# Patient Record
Sex: Female | Born: 1980
Health system: Southern US, Community
[De-identification: ages and names within clinical notes are randomized; demographics above are authoritative.]

## PROBLEM LIST (undated history)

## (undated) ENCOUNTER — Inpatient Hospital Stay (HOSPITAL_COMMUNITY): Payer: Self-pay

## (undated) DIAGNOSIS — M1990 Unspecified osteoarthritis, unspecified site: Secondary | ICD-10-CM

## (undated) DIAGNOSIS — K8020 Calculus of gallbladder without cholecystitis without obstruction: Secondary | ICD-10-CM

## (undated) DIAGNOSIS — G43909 Migraine, unspecified, not intractable, without status migrainosus: Secondary | ICD-10-CM

## (undated) DIAGNOSIS — F329 Major depressive disorder, single episode, unspecified: Secondary | ICD-10-CM

## (undated) DIAGNOSIS — Z8742 Personal history of other diseases of the female genital tract: Secondary | ICD-10-CM

## (undated) DIAGNOSIS — F32A Depression, unspecified: Secondary | ICD-10-CM

## (undated) DIAGNOSIS — I8390 Asymptomatic varicose veins of unspecified lower extremity: Secondary | ICD-10-CM

## (undated) DIAGNOSIS — R87619 Unspecified abnormal cytological findings in specimens from cervix uteri: Secondary | ICD-10-CM

## (undated) DIAGNOSIS — E669 Obesity, unspecified: Secondary | ICD-10-CM

## (undated) DIAGNOSIS — IMO0002 Reserved for concepts with insufficient information to code with codable children: Secondary | ICD-10-CM

## (undated) HISTORY — DX: Asymptomatic varicose veins of unspecified lower extremity: I83.90

## (undated) HISTORY — DX: Reserved for concepts with insufficient information to code with codable children: IMO0002

## (undated) HISTORY — DX: Personal history of other diseases of the female genital tract: Z87.42

## (undated) HISTORY — DX: Obesity, unspecified: E66.9

## (undated) HISTORY — DX: Unspecified abnormal cytological findings in specimens from cervix uteri: R87.619

## (undated) HISTORY — PX: GALLBLADDER SURGERY: SHX652

## (undated) HISTORY — DX: Unspecified osteoarthritis, unspecified site: M19.90

## (undated) HISTORY — DX: Depression, unspecified: F32.A

## (undated) HISTORY — DX: Calculus of gallbladder without cholecystitis without obstruction: K80.20

## (undated) HISTORY — DX: Major depressive disorder, single episode, unspecified: F32.9

## (undated) HISTORY — PX: WISDOM TOOTH EXTRACTION: SHX21

---

## 2003-01-26 ENCOUNTER — Other Ambulatory Visit: Admission: RE | Admit: 2003-01-26 | Discharge: 2003-01-26 | Payer: Self-pay | Admitting: *Deleted

## 2005-05-02 ENCOUNTER — Other Ambulatory Visit: Admission: RE | Admit: 2005-05-02 | Discharge: 2005-05-02 | Payer: Self-pay | Admitting: *Deleted

## 2006-04-16 DIAGNOSIS — R87619 Unspecified abnormal cytological findings in specimens from cervix uteri: Secondary | ICD-10-CM

## 2006-04-16 HISTORY — DX: Unspecified abnormal cytological findings in specimens from cervix uteri: R87.619

## 2006-04-16 HISTORY — PX: LEEP: SHX91

## 2006-08-28 ENCOUNTER — Other Ambulatory Visit: Admission: RE | Admit: 2006-08-28 | Discharge: 2006-08-28 | Payer: Self-pay | Admitting: *Deleted

## 2006-11-05 ENCOUNTER — Other Ambulatory Visit: Admission: RE | Admit: 2006-11-05 | Discharge: 2006-11-05 | Payer: Self-pay | Admitting: *Deleted

## 2006-12-30 ENCOUNTER — Ambulatory Visit (HOSPITAL_COMMUNITY): Admission: RE | Admit: 2006-12-30 | Discharge: 2006-12-30 | Payer: Self-pay | Admitting: *Deleted

## 2006-12-30 ENCOUNTER — Encounter (INDEPENDENT_AMBULATORY_CARE_PROVIDER_SITE_OTHER): Payer: Self-pay | Admitting: *Deleted

## 2007-07-14 ENCOUNTER — Other Ambulatory Visit: Admission: RE | Admit: 2007-07-14 | Discharge: 2007-07-14 | Payer: Self-pay | Admitting: *Deleted

## 2007-09-16 ENCOUNTER — Encounter: Admission: RE | Admit: 2007-09-16 | Discharge: 2007-09-16 | Payer: Self-pay | Admitting: Gynecology

## 2008-08-23 ENCOUNTER — Encounter: Admission: RE | Admit: 2008-08-23 | Discharge: 2008-08-23 | Payer: Self-pay | Admitting: Family Medicine

## 2010-01-30 ENCOUNTER — Ambulatory Visit: Payer: Self-pay | Admitting: Obstetrics & Gynecology

## 2010-01-30 LAB — CONVERTED CEMR LAB
HCT: 39.6 % (ref 36.0–46.0)
Hemoglobin: 13.2 g/dL (ref 12.0–15.0)
MCHC: 33.3 g/dL (ref 30.0–36.0)
MCV: 89 fL (ref 78.0–100.0)
Platelets: 369 10*3/uL (ref 150–400)
RBC: 4.45 M/uL (ref 3.87–5.11)
RDW: 13 % (ref 11.5–15.5)
TSH: 0.747 microintl units/mL (ref 0.350–4.500)
WBC: 9.4 10*3/uL (ref 4.0–10.5)

## 2010-02-09 ENCOUNTER — Ambulatory Visit (HOSPITAL_COMMUNITY): Admission: RE | Admit: 2010-02-09 | Discharge: 2010-02-09 | Payer: Self-pay | Admitting: Obstetrics & Gynecology

## 2010-02-23 ENCOUNTER — Ambulatory Visit: Payer: Self-pay | Admitting: Obstetrics & Gynecology

## 2010-02-23 LAB — CONVERTED CEMR LAB
Chlamydia, Swab/Urine, PCR: NEGATIVE
GC Probe Amp, Urine: NEGATIVE

## 2010-05-29 ENCOUNTER — Ambulatory Visit: Payer: 59 | Admitting: Obstetrics & Gynecology

## 2010-05-29 DIAGNOSIS — Z01419 Encounter for gynecological examination (general) (routine) without abnormal findings: Secondary | ICD-10-CM

## 2010-05-29 DIAGNOSIS — Z1272 Encounter for screening for malignant neoplasm of vagina: Secondary | ICD-10-CM

## 2010-07-07 NOTE — Assessment & Plan Note (Signed)
NAME:  Kristin, [name] NO.:  [id number]  MEDICAL RECORD NO.:  [id number]           PATIENT TYPE:  LOCATION:  [facility] at [facility]           FACILITY:  PHYSICIAN:  Allie Bossier, MD        DATE OF BIRTH:  April 15, 1981  DATE OF SERVICE:  05/29/2010                                 CLINIC NOTE  HISTORY OF PRESENT ILLNESS:  Kristin [name] is a 30 year old single white gravida 0 who was previously seen by me for severe dysmenorrhea.  She has been placed on Loestrin.  Her periods have dwindled to 1-2 days with some brown spotting and her pain is virtually nonexistent.  She has no GYN complaints.  She would like a prescription for generic birth control pills, as she cannot afford the 40 dollars but nevertheless does require them on a monthly basis.  PAST MEDICAL HISTORY:  She is a smoker, history of varicose vein status post vein injections, history of dysmenorrhea, history of the HPV Pap. She is overweight and she is "emotional" with her periods.  REVIEW OF SYSTEMS:  She is status post a Gardasil series.  Her last Pap smear was normal.  She denies dyspareunia.  MEDICATIONS:  Loestrin daily.  FAMILY HISTORY:  Her mom has breast cancer and that her maternal grandmother.  There is no GYN or colon malignancies.  PAST SURGICAL HISTORY:  She had a LEEP in 2008 and wisdom tooth extraction.  ALLERGIES:  No latex allergies.  No known drug allergies.  SOCIAL HISTORY:  She smokes half-a-pack a day for the last 10 years and drinks alcohol on a social basis.  PHYSICAL EXAMINATION:  GENERAL:  Well-nourished, well-hydrated, pleasant white female, height 5 feet 71/2 inches.  Weight 172, blood pressure 126/75, and pulse 81. HEENT:  Normal. HEART:  Regular rate and rhythm. LUNGS:  Clear to auscultation bilaterally. BREASTS:  Normal.  On her right breast, she has a 1-cm marble-like lesion just to the right of her nipple at the 9 o'clock position. ABDOMEN:  Benign. EXTERNAL GENITALIA:  No  lesions (please note the patient has been concerned about area on her right labia majora, but what I am seeing is normal).  Cervix nulliparous.  Uterus normal size and shape retroverted. Adnexa nontender.  No masses.  ASSESSMENT AND PLAN: 1. Annual exam.  I have checked Pap smear.  Recommend self-breast and     self-vulvar exams.  Please note, her cervical cultures were     recently negative.  So, I did not repeat them today. 2. Dysmenorrhea.  We will continue with oral contraceptive pills.  I     have written her a generic prescription. 3. With regard to this her area that she was concerned about on her     right labia majora, I have suggested that she keep on it on a     monthly basis and if it changes over the next several months bring     it again. 4. Breast mass.  Diera states that this has been evaluated with     ultrasound as well as a visit to the breast clinic.  She says it     has not changed in years, and  I will obtain these records before     proceeding.  She is aware that she has strong family history for     breast cancer, and she has been made aware of the BRCA1 and 2     testing.     Allie Bossier, MD    MCD/MEDQ  D:  05/29/2010  T:  05/30/2010  Job:  [id number]

## 2010-08-29 NOTE — Assessment & Plan Note (Signed)
NAME:  Kristin [name], Kristin [name] NO.:  [id number]   MEDICAL RECORD NO.:  [id number]          PATIENT TYPE:  POB   LOCATION:  [facility] at [facility]         FACILITY:  [facility]   PHYSICIAN:  Allie Bossier, MD        DATE OF BIRTH:  05/31/1980   DATE OF SERVICE:                                  CLINIC NOTE   Kristin [name] is a 30 year old single white gravida 0 who was seen here for a  new patient last month.  She was coming in because of irregular periods  as well as severe dysmenorrhea.  She was given a prescription for  Ponstel to be used on a p.r.n. basis and I ordered a TSH, CBC and GYN  ultrasound.  I also ordered cervical cultures, but they were not done  before she left.  She had her ultrasound, it was entirely normal.  Her  labs were also normal including a normal TSH and normal white count and  hemoglobin.  Today, she tells me that she has not had a period since she  saw me last and says she has not taken the Ponstel.  I had given her  information about Mirena at her last visit and she is uncertain whether  she wants Mirena.  After further discussion today about combination OCPs  as well as Mirena, she has opted to have combination OCPs.  She was  previously not placed on them because of varicose veins, however, I  have told her that varicose veins are not a contraindication to using  combination OCPs.  I have given her samples of 3 months for Loestrin as  well as a prescription.  She will get a blood pressure checked in 2  weeks (she will have this done in [place] and call the results here).  She will start her pills today.  She gives me a history that with  progestin-only pills, she gets occasional darkening of her facial skin.  I have explained that this can occur with combination OCPs and that if  she does get that she should stop them immediately.  She will consider a  Mirena in the in the future.      Allie Bossier, MD     MCD/MEDQ  D:  02/23/2010  T:  02/24/2010   Job:  [id number]

## 2010-08-29 NOTE — Op Note (Signed)
NAMEINANNA, TELFORD NO.:  [id number]   MEDICAL RECORD NO.:  [id number]          PATIENT TYPE:  AMB   LOCATION:  DAY                          FACILITY:  [facility]   PHYSICIAN:  Almedia Balls. Fore, M.D.   DATE OF BIRTH:  1980-06-08   DATE OF PROCEDURE:  12/30/2006  DATE OF DISCHARGE:                               OPERATIVE REPORT   PREOPERATIVE DIAGNOSIS:  High-grade squamous intraepithelial lesion.   POSTOPERATIVE DIAGNOSIS:  High-grade squamous intraepithelial lesion  pending pathology.   OPERATION:  LEEP of cervix.   ANESTHESIA:  MAC with 10 mL 1% lidocaine paracervical block.   INDICATIONS FOR SURGERY:  The patient is a 30 year old with the above-  noted problems for a LEEP procedure as noted above.  She was fully  counseled as to the nature of the procedure, and the risks involved  including risks of anesthesia, injury to the cervix, uterus, tubes,  ovaries, bowel, bladder blood vessels, ureters, postoperative  hemorrhage, infection, and recuperation.  She fully understands all  these considerations and has signed informed consent to proceed on  December 30, 2006.   OPERATIVE FINDINGS:  On staining the cervix with dilute acetic acid  solution, there were noted to acetyl white areas on the anterior and  some on the posterior lips of the cervix.   DESCRIPTION OF PROCEDURE:  With the patient under sedation, prepared and  draped in the usual sterile fashion in the dorsal lithotomy position, a  speculum was placed in the vagina.  The acetic acid solution was placed  on the cervix with the above-noted findings.  A solution of 1% lidocaine  with 1:100,000 epinephrine was injected circumferentially on the cervix  for a total of 10 mL for hemostasis and anesthesia.   The 10 x 10 loop electrode with the guard taking it down to  approximately a 5 mm depth was then utilized to remove the anterior lip  of the cervix, posterior lip of the cervix, and the endocervical  area.  The area was then cauterized using the large ball electrode at 50 watts  of power.  A solution of Monsel's suspension was then utilized for  further hemostasis.   The area was observed for several minutes, following the procedure, and  after noting that hemostasis was maintained, and that sponge and  instrument counts were correct, the procedure was terminated.  Estimated  blood loss less than 10 mL.  The patient was taken to the recovery room  in good condition.   FOLLOWUP CARE:  She will return the office in 2 weeks for followup and  was instructed to call if heavy bleeding, pain, or unexplained fever  should ensue.  The specimen went to pathology.          ______________________________  Almedia Balls Randell Patient, M.D.    SRF/MEDQ  D:  12/30/2006  T:  12/30/2006  Job:  [id number]

## 2010-08-29 NOTE — Assessment & Plan Note (Signed)
NAME:  Kristin [name], Kristin [name] NO.:  [id number]   MEDICAL RECORD NO.:  [id number]          PATIENT TYPE:  POB   LOCATION:  [facility] at [facility]         FACILITY:  [facility]   PHYSICIAN:  Allie Bossier, MD        DATE OF BIRTH:  February 09, 1981   DATE OF SERVICE:  01/30/2010                                  CLINIC NOTE   Kristin [name] is a 30 year old single white gravida 0 who comes in at the  referral from mother Kristin [name] to evaluate her severely painful  periods and her lengthening cycles as well as 2 periods a month.  She  was placed on progestin only birth control pills about a year ago by her  previous gynecologist who used progestin only pills because she has  severe varicose veins and she has complained of irregular bleeding since  that time.  Prior to that, she started her period at age 64 and prior to  that, the birth control pills was having monthly periods, but they were  extremely painful, almost disabling.  She occasionally misses work.  She  previously would miss school and over-the-counter pain medicines has not  been effective in relieving her pain.  She says the cervical cultures in  the past are negative.   PAST MEDICAL HISTORY:  Varicose vein.   She is a smoker, dysmenorrhea, history of HPV Pap smears, and she is  overweight.   PAST SURGICAL HISTORY:  She had a LEEP in 2008 and wisdom teeth  extraction.   FAMILY HISTORY:  Positive for breast cancer in her mother (negative BRCA  1 and 2 in the mother) and maternal grandmother.  No family history of  GYN or colon malignancies.   No latex allergies.  No known drug allergies.   SOCIAL HISTORY:  She smokes half-a-pack a day for last 10 years and she  reports social alcohol use.   REVIEW OF SYSTEMS:  She has had a Gardasil series.  Her last Pap smear  was normal that was done in January of this year and she denies  dyspareunia.   PHYSICAL EXAMINATION:  GENERAL:  Well-nourished, well-hydrated pleasant  white  female.  VITAL SIGNS:  Height 5 feet 7.5 inches, weight 167, blood pressure  129/83, pulse 98.  EXTERNAL GENITALIA:  Shaved, no lesions.  Cervix small amount of  bleeding (she is finishing her period).  Uterus is normal size and  shape, retroverted, tender.  Bimanual exam, her adnexa are nontender  with no masses.   ASSESSMENT AND PLAN:  1. Dysfunctional uterine bleeding.  This may be related to her      progestin only pill.  However, I have done cervical cultures, a      TSH, and CBC.  I am ordering a GYN ultrasound.  I have given her      literature on a Mirena IUD.  I have also briefly told her that      varicose veins are not a contraindication to estrogen containing      oral contraceptive pills.  2. Severe dysmenorrhea.  I have given her prescription and samples for      Ponstel (250 mg p.o.  q.6 h. p.r.n. pain and I am very suspicious      that she has endometriosis).  Again, combination of OCPs or Mirena      IUD would be      one effective form of birth control that would hopefully relieve      her dysmenorrhea as well as her irregular periods.  I will see her      back when the ultrasound results are available.      Allie Bossier, MD     MCD/MEDQ  D:  01/30/2010  T:  01/31/2010  Job:  [id number]

## 2011-01-25 LAB — PREGNANCY, URINE: Preg Test, Ur: NEGATIVE

## 2011-06-07 ENCOUNTER — Encounter: Payer: Self-pay | Admitting: Obstetrics & Gynecology

## 2011-06-07 ENCOUNTER — Ambulatory Visit (INDEPENDENT_AMBULATORY_CARE_PROVIDER_SITE_OTHER): Payer: 59 | Admitting: Obstetrics & Gynecology

## 2011-06-07 DIAGNOSIS — Z1272 Encounter for screening for malignant neoplasm of vagina: Secondary | ICD-10-CM

## 2011-06-07 DIAGNOSIS — Z113 Encounter for screening for infections with a predominantly sexual mode of transmission: Secondary | ICD-10-CM

## 2011-06-07 DIAGNOSIS — Z0000 Encounter for general adult medical examination without abnormal findings: Secondary | ICD-10-CM

## 2011-06-07 DIAGNOSIS — N63 Unspecified lump in unspecified breast: Secondary | ICD-10-CM

## 2011-06-07 DIAGNOSIS — Z01419 Encounter for gynecological examination (general) (routine) without abnormal findings: Secondary | ICD-10-CM

## 2011-06-07 MED ORDER — NORETHIN ACE-ETH ESTRAD-FE 1-20 MG-MCG PO TABS: 1.0000 | ORAL_TABLET | Freq: Every day | ORAL | Status: DC

## 2011-06-07 NOTE — Progress Notes (Signed)
Subjective:    Kristin [name] is a 31 y.o. female who presents for an annual exam. The patient has no complaints today. She needs a refill of her generic Lo loestrin. The patient is sexually active. GYN screening history: last pap: was normal. The patient wears seatbelts: yes. The patient participates in regular exercise: no. Has the patient ever been transfused or tattooed?: yes. The patient reports that there is not domestic violence in her life.   Menstrual History: OB History    Grav Para Term Preterm Abortions TAB SAB Ect Mult Living   0 0 0 0 0 0 0 0 0 0       Menarche age: 47 Patient's last menstrual period was 05/11/2011.    The following portions of the patient's history were reviewed and updated as appropriate: allergies, current medications, past family history, past medical history, past social history, past surgical history and problem list.  Review of Systems A comprehensive review of systems was negative. She is still acting, has a new partner for the last 5 months, denies dysparunia, says her primary care MD checked her TSH recently and it was normal. She had a breast u/s in about 2009 after she found a right breast mass but did not follow up.   Objective:    BP 129/86  Pulse 82  Ht 5\' 7"  (1.702 m)  Wt 186 lb (84.369 kg)  BMI 29.13 kg/m2  LMP 05/11/2011  General Appearance:    Alert, cooperative, no distress, appears stated age  Head:    Normocephalic, without obvious abnormality, atraumatic  Eyes:    PERRL, conjunctiva/corneas clear, EOM's intact, fundi    benign, both eyes  Ears:    Normal TM's and external ear canals, both ears  Nose:   Nares normal, septum midline, mucosa normal, no drainage    or sinus tenderness  Throat:   Lips, mucosa, and tongue normal; teeth and gums normal  Neck:   Supple, symmetrical, trachea midline, no adenopathy;    thyroid:  no enlargement/tenderness/nodules; no carotid   bruit or JVD  Back:     Symmetric, no curvature, ROM normal,  no CVA tenderness  Lungs:     Clear to auscultation bilaterally, respirations unlabored  Chest Wall:    No tenderness or deformity   Heart:    Regular rate and rhythm, S1 and S2 normal, no murmur, rub   or gallop  Breast Exam:    A 1 cm firm mobile mass at the 9 o'clock position just beside the right nipple. She says it hasn't changed in 3 years.  Abdomen:     Soft, non-tender, bowel sounds active all four quadrants,    no masses, no organomegaly  Genitalia:    Normal female without lesion, discharge or tenderness, NSSA, NT, no adnexal masses     Extremities:   Extremities normal, atraumatic, no cyanosis or edema  Pulses:   2+ and symmetric all extremities  Skin:   Skin color, texture, turgor normal, no rashes or lesions  Lymph nodes:   Cervical, supraclavicular, and axillary nodes normal  Neurologic:   CNII-XII intact, normal strength, sensation and reflexes    throughout  .    Assessment:    Healthy female exam.  Right breast mass   Plan:     Mammogram. Pap smear.  Referral to general surgeon for second opinion

## 2011-06-11 ENCOUNTER — Other Ambulatory Visit: Payer: Self-pay | Admitting: Obstetrics & Gynecology

## 2011-06-13 ENCOUNTER — Ambulatory Visit
Admission: RE | Admit: 2011-06-13 | Discharge: 2011-06-13 | Disposition: A | Discharge status: Home / Self Care | Payer: 59 | Source: Ambulatory Visit | Attending: Obstetrics & Gynecology | Admitting: Obstetrics & Gynecology

## 2011-06-13 ENCOUNTER — Other Ambulatory Visit: Payer: Self-pay | Admitting: Obstetrics & Gynecology

## 2011-06-13 DIAGNOSIS — N63 Unspecified lump in unspecified breast: Secondary | ICD-10-CM

## 2011-06-25 ENCOUNTER — Encounter: Payer: 59 | Admitting: Obstetrics & Gynecology

## 2011-07-10 ENCOUNTER — Ambulatory Visit
Admission: RE | Admit: 2011-07-10 | Discharge: 2011-07-10 | Disposition: A | Discharge status: Home / Self Care | Payer: 59 | Source: Ambulatory Visit | Attending: Obstetrics & Gynecology | Admitting: Obstetrics & Gynecology

## 2011-07-10 DIAGNOSIS — N63 Unspecified lump in unspecified breast: Secondary | ICD-10-CM

## 2011-07-12 ENCOUNTER — Other Ambulatory Visit: Payer: Self-pay | Admitting: Obstetrics & Gynecology

## 2011-08-02 ENCOUNTER — Encounter: Payer: Self-pay | Admitting: Obstetrics & Gynecology

## 2011-08-02 ENCOUNTER — Ambulatory Visit (INDEPENDENT_AMBULATORY_CARE_PROVIDER_SITE_OTHER): Payer: 59 | Admitting: Obstetrics & Gynecology

## 2011-08-02 VITALS — BP 124/82 | HR 82 | Ht 67.0 in | Wt 193.0 lb

## 2011-08-02 DIAGNOSIS — Z01812 Encounter for preprocedural laboratory examination: Secondary | ICD-10-CM

## 2011-08-02 DIAGNOSIS — R87612 Low grade squamous intraepithelial lesion on cytologic smear of cervix (LGSIL): Secondary | ICD-10-CM

## 2011-08-02 NOTE — Progress Notes (Signed)
Patient is here for colposcopy today.

## 2011-08-02 NOTE — Progress Notes (Signed)
Patient ID: Kristin [name], female   DOB: April 10, 1981, 31 y.o.   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 She has had a LEEP in the past with Dr. Randell Patient.  O. UPT negative, consent signed, time out done      Cervix treated with acetic acid.       Colposcopy adequate, changes c/w LEEP noted. Only mild dysplasia noted.      ECC done  A/P. LGSIL on pap, c/w colpo findings. If ECC negative, pap in 6 months.

## 2012-02-26 ENCOUNTER — Ambulatory Visit (INDEPENDENT_AMBULATORY_CARE_PROVIDER_SITE_OTHER): Payer: 59 | Admitting: Obstetrics & Gynecology

## 2012-02-26 ENCOUNTER — Encounter: Payer: Self-pay | Admitting: Obstetrics & Gynecology

## 2012-02-26 VITALS — BP 141/95 | HR 95 | Ht 67.0 in | Wt 197.0 lb

## 2012-02-26 DIAGNOSIS — Z202 Contact with and (suspected) exposure to infections with a predominantly sexual mode of transmission: Secondary | ICD-10-CM

## 2012-02-26 DIAGNOSIS — Z9189 Other specified personal risk factors, not elsewhere classified: Secondary | ICD-10-CM

## 2012-02-26 NOTE — Progress Notes (Signed)
  Subjective:    Patient ID: Kristin [name], female    DOB: 1980-05-27, 31 y.o.   MRN: [id number]  HPI  [name] is a SW G0 whose boyfriend noted white lesions on his penis that sound consistent with molloscum. He will seeing his doctor today. She wants to have herself checked.  Review of Systems Her annual and pap smear are due 1/14.    Objective:   Physical Exam Clean shaven vulva Normal vagina and discharge Ectropion noted on cervix       Assessment & Plan:   Reassurance given I will check for GC/CT today RTC 2 3 months/prn sooner

## 2012-05-09 ENCOUNTER — Other Ambulatory Visit: Payer: Self-pay | Admitting: Family Medicine

## 2012-08-04 ENCOUNTER — Other Ambulatory Visit: Payer: Self-pay | Admitting: Obstetrics & Gynecology

## 2012-08-07 ENCOUNTER — Ambulatory Visit: Payer: 59 | Admitting: Obstetrics & Gynecology

## 2012-09-30 ENCOUNTER — Ambulatory Visit (INDEPENDENT_AMBULATORY_CARE_PROVIDER_SITE_OTHER): Payer: 59 | Admitting: Obstetrics & Gynecology

## 2012-09-30 ENCOUNTER — Encounter: Payer: Self-pay | Admitting: Obstetrics & Gynecology

## 2012-09-30 VITALS — BP 118/77 | HR 86 | Ht 67.5 in | Wt 194.0 lb

## 2012-09-30 DIAGNOSIS — Z113 Encounter for screening for infections with a predominantly sexual mode of transmission: Secondary | ICD-10-CM

## 2012-09-30 DIAGNOSIS — Z0000 Encounter for general adult medical examination without abnormal findings: Secondary | ICD-10-CM

## 2012-09-30 DIAGNOSIS — Z124 Encounter for screening for malignant neoplasm of cervix: Secondary | ICD-10-CM

## 2012-09-30 DIAGNOSIS — R6882 Decreased libido: Secondary | ICD-10-CM

## 2012-09-30 DIAGNOSIS — Z01419 Encounter for gynecological examination (general) (routine) without abnormal findings: Secondary | ICD-10-CM

## 2012-09-30 DIAGNOSIS — Z1151 Encounter for screening for human papillomavirus (HPV): Secondary | ICD-10-CM

## 2012-09-30 NOTE — Progress Notes (Signed)
Here today for gyn physical.  Has noticed decreased libido.

## 2012-09-30 NOTE — Progress Notes (Addendum)
Subjective:    Kristin [name] is a 32 y.o. female who presents for an annual exam. The patient has no complaints today except "I have no sex drive and I feel tired all the time". She had her TSH checked by Dr. Laurine Blazer (FP). She also had a sleep study that was reportedly normal. The patient is sexually active (once or twice per week) She has some stress, works at the [employer] and then comes home to 3 kids (13,8, and 5 yo). GYN screening history: last pap: was normal. The patient wears seatbelts: yes. The patient participates in regular exercise: yes. Has the patient ever been transfused or tattooed?: yes. The patient reports that there is not domestic violence in her life.   Menstrual History: OB History   Grav Para Term Preterm Abortions TAB SAB Ect Mult Living   0 0 0 0 0 0 0 0 0 0       Menarche age: 8  Patient's last menstrual period was 09/28/2012.    The following portions of the patient's history were reviewed and updated as appropriate: allergies, current medications, past family history, past medical history, past social history, past surgical history and problem list.  Review of Systems A comprehensive review of systems was negative.  She moved in with her fiance about 1 year ago. Her libido has been decreasing for the last 9 months.   Objective:    BP 118/77  Pulse 86  Ht 5' 7.5" (1.715 m)  Wt 194 lb (87.998 kg)  BMI 29.92 kg/m2  LMP 09/28/2012  General Appearance:    Alert, cooperative, no distress, appears stated age, tearful (same at each visit)  Head:    Normocephalic, without obvious abnormality, atraumatic  Eyes:    PERRL, conjunctiva/corneas clear, EOM's intact, fundi    benign, both eyes  Ears:    Normal TM's and external ear canals, both ears  Nose:   Nares normal, septum midline, mucosa normal, no drainage    or sinus tenderness  Throat:   Lips, mucosa, and tongue normal; teeth and gums normal  Neck:   Supple, symmetrical, trachea midline, no adenopathy;   thyroid:  no enlargement/tenderness/nodules; no carotid   bruit or JVD  Back:     Symmetric, no curvature, ROM normal, no CVA tenderness  Lungs:     Clear to auscultation bilaterally, respirations unlabored  Chest Wall:    No tenderness or deformity   Heart:    Regular rate and rhythm, S1 and S2 normal, no murmur, rub   or gallop  Breast Exam:    No tenderness, masses, or nipple abnormality of left. Right breast with 1.5 cm mobile lump, same as last year (biopsy negative). She does her SBEs and says that it is the same  Abdomen:     Soft, non-tender, bowel sounds active all four quadrants,    no masses, no organomegaly  Genitalia:    Normal female without lesion, discharge or tenderness, NSSA, NT, normal adnexal exam      Extremities:   Extremities normal, atraumatic, no cyanosis or edema  Pulses:   2+ and symmetric all extremities  Skin:   Skin color, texture, turgor normal, no rashes or lesions  Lymph nodes:   Cervical, supraclavicular, and axillary nodes normal  Neurologic:   CNII-XII intact, normal strength, sensation and reflexes    throughout  .    Assessment:    Healthy female exam.  Decreased libido   Plan:     Thin prep Pap  smear.  with HPV cotesting Check free testosterone

## 2012-10-01 LAB — TESTOSTERONE, FREE, TOTAL, SHBG
Sex Hormone Binding: 67 nmol/L (ref 18–114)
Testosterone, Free: 5.7 pg/mL (ref 0.6–6.8)
Testosterone: 51 ng/dL (ref 10–70)

## 2013-04-30 ENCOUNTER — Ambulatory Visit (INDEPENDENT_AMBULATORY_CARE_PROVIDER_SITE_OTHER): Payer: 59 | Admitting: Obstetrics & Gynecology

## 2013-04-30 ENCOUNTER — Encounter: Payer: Self-pay | Admitting: Obstetrics & Gynecology

## 2013-04-30 VITALS — BP 130/94 | HR 78 | Ht 67.0 in | Wt 199.0 lb

## 2013-04-30 DIAGNOSIS — N926 Irregular menstruation, unspecified: Secondary | ICD-10-CM

## 2013-04-30 DIAGNOSIS — N925 Other specified irregular menstruation: Secondary | ICD-10-CM

## 2013-04-30 DIAGNOSIS — N949 Unspecified condition associated with female genital organs and menstrual cycle: Secondary | ICD-10-CM

## 2013-04-30 MED ORDER — NORETHIN ACE-ETH ESTRAD-FE 1-20 MG-MCG PO TABS: 1.0000 | ORAL_TABLET | Freq: Every day | ORAL | Status: DC

## 2013-04-30 NOTE — Progress Notes (Signed)
   Subjective:    Patient ID: Kristin [name], female    DOB: 1980-08-19, 33 y.o.   MRN: [id number]  HPI 33 yo engaged W P0 on Lo lo estrin here today because she is 3 weeks late for her period. She had a negative blood and urine pregnancy on 04-23-13 [facility]. Her wedding is in 4 months. She is under a fair amount of stress.   Review of Systems Her flu vaccine and pap are UTD.    Objective:   Physical Exam        Assessment & Plan:  3 weeks late for period- most likely due to her OCPs and thin uterine lining but I will check a TSH.

## 2013-05-01 LAB — TSH: TSH: 1.31 u[IU]/mL (ref 0.350–4.500)

## 2013-09-18 ENCOUNTER — Other Ambulatory Visit: Payer: Self-pay | Admitting: Oral Surgery

## 2013-12-12 ENCOUNTER — Emergency Department (HOSPITAL_BASED_OUTPATIENT_CLINIC_OR_DEPARTMENT_OTHER)
Admission: EM | Admit: 2013-12-12 | Discharge: 2013-12-12 | Disposition: A | Discharge status: Home / Self Care | Payer: 59 | Attending: Emergency Medicine | Admitting: Emergency Medicine

## 2013-12-12 ENCOUNTER — Encounter (HOSPITAL_BASED_OUTPATIENT_CLINIC_OR_DEPARTMENT_OTHER): Payer: Self-pay | Admitting: Emergency Medicine

## 2013-12-12 DIAGNOSIS — R112 Nausea with vomiting, unspecified: Secondary | ICD-10-CM | POA: Insufficient documentation

## 2013-12-12 DIAGNOSIS — F17200 Nicotine dependence, unspecified, uncomplicated: Secondary | ICD-10-CM | POA: Diagnosis not present

## 2013-12-12 DIAGNOSIS — G43909 Migraine, unspecified, not intractable, without status migrainosus: Secondary | ICD-10-CM | POA: Diagnosis not present

## 2013-12-12 DIAGNOSIS — Z8679 Personal history of other diseases of the circulatory system: Secondary | ICD-10-CM | POA: Insufficient documentation

## 2013-12-12 DIAGNOSIS — R197 Diarrhea, unspecified: Secondary | ICD-10-CM | POA: Insufficient documentation

## 2013-12-12 DIAGNOSIS — F329 Major depressive disorder, single episode, unspecified: Secondary | ICD-10-CM | POA: Insufficient documentation

## 2013-12-12 DIAGNOSIS — R1084 Generalized abdominal pain: Secondary | ICD-10-CM | POA: Insufficient documentation

## 2013-12-12 DIAGNOSIS — Z8742 Personal history of other diseases of the female genital tract: Secondary | ICD-10-CM | POA: Diagnosis not present

## 2013-12-12 DIAGNOSIS — Z79899 Other long term (current) drug therapy: Secondary | ICD-10-CM | POA: Insufficient documentation

## 2013-12-12 DIAGNOSIS — F3289 Other specified depressive episodes: Secondary | ICD-10-CM | POA: Diagnosis not present

## 2013-12-12 DIAGNOSIS — Z8719 Personal history of other diseases of the digestive system: Secondary | ICD-10-CM | POA: Diagnosis not present

## 2013-12-12 DIAGNOSIS — R000 Tachycardia, unspecified: Secondary | ICD-10-CM | POA: Insufficient documentation

## 2013-12-12 DIAGNOSIS — Z8739 Personal history of other diseases of the musculoskeletal system and connective tissue: Secondary | ICD-10-CM | POA: Diagnosis not present

## 2013-12-12 HISTORY — DX: Migraine, unspecified, not intractable, without status migrainosus: G43.909

## 2013-12-12 LAB — URINALYSIS, ROUTINE W REFLEX MICROSCOPIC
Bilirubin Urine: NEGATIVE
Glucose, UA: NEGATIVE mg/dL
Ketones, ur: NEGATIVE mg/dL
Leukocytes, UA: NEGATIVE
Nitrite: NEGATIVE
Protein, ur: NEGATIVE mg/dL
SPECIFIC GRAVITY, URINE: 1.009 (ref 1.005–1.030)
Urobilinogen, UA: 1 mg/dL (ref 0.0–1.0)
pH: 7 (ref 5.0–8.0)

## 2013-12-12 LAB — CBC WITH DIFFERENTIAL/PLATELET
BASOS PCT: 0 % (ref 0–1)
Band Neutrophils: 2 % (ref 0–10)
Basophils Absolute: 0 10*3/uL (ref 0.0–0.1)
Eosinophils Absolute: 0.1 10*3/uL (ref 0.0–0.7)
Eosinophils Relative: 2 % (ref 0–5)
HCT: 38.6 % (ref 36.0–46.0)
HEMOGLOBIN: 13 g/dL (ref 12.0–15.0)
LYMPHS PCT: 39 % (ref 12–46)
Lymphs Abs: 2.7 10*3/uL (ref 0.7–4.0)
MCH: 30.1 pg (ref 26.0–34.0)
MCHC: 33.7 g/dL (ref 30.0–36.0)
MCV: 89.4 fL (ref 78.0–100.0)
Monocytes Absolute: 0.8 10*3/uL (ref 0.1–1.0)
Monocytes Relative: 11 % (ref 3–12)
Neutro Abs: 3.3 10*3/uL (ref 1.7–7.7)
Neutrophils Relative %: 46 % (ref 43–77)
Platelets: 296 10*3/uL (ref 150–400)
RBC: 4.32 MIL/uL (ref 3.87–5.11)
RDW: 12.1 % (ref 11.5–15.5)
WBC: 6.9 10*3/uL (ref 4.0–10.5)

## 2013-12-12 LAB — COMPREHENSIVE METABOLIC PANEL
ALBUMIN: 3.3 g/dL — AB (ref 3.5–5.2)
ALK PHOS: 58 U/L (ref 39–117)
ALT: 14 U/L (ref 0–35)
ANION GAP: 13 (ref 5–15)
AST: 20 U/L (ref 0–37)
BUN: 5 mg/dL — AB (ref 6–23)
CO2: 25 mEq/L (ref 19–32)
Calcium: 9.2 mg/dL (ref 8.4–10.5)
Chloride: 105 mEq/L (ref 96–112)
Creatinine, Ser: 0.7 mg/dL (ref 0.50–1.10)
GFR calc Af Amer: >90 mL/min (ref >90)
GFR calc non Af Amer: >90 mL/min (ref >90)
Glucose, Bld: 91 mg/dL (ref 70–99)
POTASSIUM: 3.2 meq/L — AB (ref 3.7–5.3)
Sodium: 143 mEq/L (ref 137–147)
TOTAL PROTEIN: 7.2 g/dL (ref 6.0–8.3)
Total Bilirubin: 0.2 mg/dL — ABNORMAL LOW (ref 0.3–1.2)

## 2013-12-12 LAB — URINE MICROSCOPIC-ADD ON

## 2013-12-12 LAB — LIPASE, BLOOD: Lipase: 40 U/L (ref 11–59)

## 2013-12-12 MED ORDER — DIPHENOXYLATE-ATROPINE 2.5-0.025 MG PO TABS: 2.0000 | ORAL_TABLET | Freq: Four times a day (QID) | ORAL | Status: DC | PRN

## 2013-12-12 MED ORDER — METOCLOPRAMIDE HCL 5 MG/ML IJ SOLN
10.0000 mg | Freq: Once | INTRAMUSCULAR | Status: AC
  Administered 2013-12-12: 10 mg via INTRAVENOUS
  Filled 2013-12-12: qty 2

## 2013-12-12 MED ORDER — HYDROCODONE-ACETAMINOPHEN 5-325 MG PO TABS: 1.0000 | ORAL_TABLET | ORAL | Status: DC | PRN

## 2013-12-12 MED ORDER — ONDANSETRON HCL 4 MG/2ML IJ SOLN
4.0000 mg | Freq: Once | INTRAMUSCULAR | Status: AC
  Administered 2013-12-12: 4 mg via INTRAVENOUS
  Filled 2013-12-12: qty 2

## 2013-12-12 MED ORDER — POTASSIUM CHLORIDE CRYS ER 20 MEQ PO TBCR
40.0000 meq | EXTENDED_RELEASE_TABLET | Freq: Once | ORAL | Status: AC
  Administered 2013-12-12: 40 meq via ORAL
  Filled 2013-12-12: qty 2

## 2013-12-12 MED ORDER — OXYCODONE-ACETAMINOPHEN 5-325 MG PO TABS: 2.0000 | ORAL_TABLET | ORAL | Status: DC | PRN

## 2013-12-12 MED ORDER — MORPHINE SULFATE 4 MG/ML IJ SOLN
4.0000 mg | Freq: Once | INTRAMUSCULAR | Status: AC
  Administered 2013-12-12: 4 mg via INTRAVENOUS
  Filled 2013-12-12: qty 1

## 2013-12-12 MED ORDER — ONDANSETRON HCL 4 MG PO TABS: 4.0000 mg | ORAL_TABLET | Freq: Four times a day (QID) | ORAL | Status: DC

## 2013-12-12 MED ORDER — SODIUM CHLORIDE 0.9 % IV BOLUS (SEPSIS)
1000.0000 mL | Freq: Once | INTRAVENOUS | Status: AC
  Administered 2013-12-12: 1000 mL via INTRAVENOUS

## 2013-12-12 NOTE — ED Notes (Signed)
Pt discharged to home with family. NAD.  

## 2013-12-12 NOTE — ED Notes (Addendum)
Patient has had N/V/D since Sunday. Intermittently able to keep fluids down, watery diarrhea, cramping, dizziness, headaches. Family states she ate sausage at a cookout and no one else in the family ate it, and no one else is sick either. Took phenergan this morning.

## 2013-12-12 NOTE — ED Provider Notes (Signed)
CSN: [id number]     Arrival date & time 12/12/13  1626 History  This chart was scribed for Orpah Greek, * by Randa Evens, ED Scribe. This patient was seen in room MH09/MH09 and the patient's care was started at 5:16 PM.      Chief Complaint  Patient presents with  . Emesis    Patient is a 33 y.o. female presenting with vomiting. The history is provided by the patient and the spouse. No language interpreter was used.  Emesis Associated symptoms: abdominal pain and diarrhea    HPI Comments: Kristin [name] is a 33 y.o. female brought in by ambulance, who presents to the Emergency Department complaining of emesis onset 6 days prior. She states she has associated nausea, diarrhea and abdominal pain. She states that she is not able to keep any food or liquids down. Spouse states she may have ate something to cause her to begin vomiting. She denies any recent sick contacts.    Past Medical History  Diagnosis Date  . Abnormal Pap smear of cervix 2008  . Vein, varicose   . History of dysmenorrhea   . Arthritis   . Depression     migrane  . Ulcer     stomach  . Migraine    Past Surgical History  Procedure Laterality Date  . Leep  2008  . Wisdom tooth extraction      x3   Family History  Problem Relation Age of Onset  . Cancer Mother     breast  . Cancer Maternal Grandmother     breast  . Depression Maternal Grandfather   . Depression Paternal Grandmother    History  Substance Use Topics  . Smoking status: Current Every Day Smoker -- 0.50 packs/day    Types: Cigarettes  . Smokeless tobacco: Not on file  . Alcohol Use: 3.5 - 5.0 oz/week    7-10 drink(s) per week     Comment: " a few drinks a night"   OB History   Grav Para Term Preterm Abortions TAB SAB Ect Mult Living   0 0 0 0 0 0 0 0 0 0      Review of Systems  Gastrointestinal: Positive for nausea, vomiting, abdominal pain and diarrhea.  All other systems reviewed and are negative.   Allergies   Vicodin  Home Medications   Prior to Admission medications   Medication Sig Start Date End Date Taking? Authorizing Provider  baclofen (LIORESAL) 10 MG tablet Take 10 mg by mouth 3 (three) times daily.   Yes Historical Provider, MD  ALPRAZolam Duanne Moron) 0.25 MG tablet  12/25/11   Historical Provider, MD  norethindrone-ethinyl estradiol (MICROGESTIN FE 1/20) 1-20 MG-MCG tablet Take 1 tablet by mouth daily. 04/30/13   [name], MD  omeprazole (PRILOSEC) 40 MG capsule  05/19/11   Historical Provider, MD  topiramate (TOPAMAX) 25 MG tablet 150 mg daily.  02/08/12   Historical Provider, MD   Triage Vitals: BP 139/96  Pulse 103  Temp(Src) 98.7 F (37.1 C) (Oral)  Resp 20  Ht 5\' 7"  (1.702 m)  Wt 195 lb (88.451 kg)  BMI 30.53 kg/m2  SpO2 100%  Physical Exam  Nursing note and vitals reviewed. Constitutional: She is oriented to person, place, and time. She appears well-developed and well-nourished. No distress.  HENT:  Head: Normocephalic and atraumatic.  Right Ear: Hearing normal.  Left Ear: Hearing normal.  Nose: Nose normal.  Mouth/Throat: Oropharynx is clear and moist and mucous membranes are  normal.  Eyes: Conjunctivae and EOM are normal. Pupils are equal, round, and reactive to light.  Neck: Normal range of motion. Neck supple.  Cardiovascular: Regular rhythm, S1 normal and S2 normal.  Exam reveals no gallop and no friction rub.   No murmur heard. Pulmonary/Chest: Effort normal and breath sounds normal. No respiratory distress. She exhibits no tenderness.  Abdominal: Soft. Normal appearance and bowel sounds are normal. There is no hepatosplenomegaly. There is tenderness. There is no rebound, no guarding, no tenderness at McBurney's point and negative Murphy's sign. No hernia.  Mild diffuse tenderness   Musculoskeletal: Normal range of motion.  Neurological: She is alert and oriented to person, place, and time. She has normal strength. No cranial nerve deficit or sensory deficit.  Coordination normal. GCS eye subscore is 4. GCS verbal subscore is 5. GCS motor subscore is 6.  Skin: Skin is warm, dry and intact. No rash noted. No cyanosis.  Psychiatric: She has a normal mood and affect. Her speech is normal and behavior is normal. Thought content normal.    ED Course  Procedures (including critical care time) DIAGNOSTIC STUDIES: Oxygen Saturation is 100% on RA, normal by my interpretation.    COORDINATION OF CARE: 5:21 PM-Discussed treatment plan which includes IV fluids with pt at bedside and pt agreed to plan.     Labs Review Labs Reviewed  URINALYSIS, ROUTINE W REFLEX MICROSCOPIC - Abnormal; Notable for the following:    Hgb urine dipstick TRACE (*)    All other components within normal limits  COMPREHENSIVE METABOLIC PANEL - Abnormal; Notable for the following:    Potassium 3.2 (*)    BUN 5 (*)    Albumin 3.3 (*)    Total Bilirubin 0.2 (*)    All other components within normal limits  URINE MICROSCOPIC-ADD ON  CBC WITH DIFFERENTIAL  LIPASE, BLOOD  PATHOLOGIST SMEAR REVIEW    Imaging Review No results found.   EKG Interpretation None      MDM   Final diagnoses:  None   abdominal pain  Vomiting  Diarrhea  His presents for evaluation of nausea, vomiting, diarrhea with abdominal cramping. Symptoms ongoing for 5 days. Patient thinks she might have been exposed to something in food to cause the illness. She did have intermittent abdominal cramping, but no focal tenderness. No guarding or rebound. Negative Murphy sign. No tenderness at McBurney's point. Patient had slight tachycardia secondary to dehydration, but was afebrile. Blood pressure normal. Blood work is entirely normal except for very slight hypokalemia. White count 6.9, reassuring. The patient had significant improvement IV fluids and meds. Will be discharged to continue symptomatic treatment, oral hydration. Return to the ER for worsening symptoms. Patient told that she should return to  the ER for imaging if she is developing fever, increased pain.   I personally performed the services described in this documentation, which was scribed in my presence. The recorded information has been reviewed and is accurate.       Orpah Greek, MD 12/12/13 [phone]

## 2013-12-12 NOTE — ED Notes (Signed)
Pt unable to keep ginger ale down. Dr Betsey Holiday notified.

## 2013-12-12 NOTE — Discharge Instructions (Signed)
Abdominal Pain °Many things can cause abdominal pain. Usually, abdominal pain is not caused by a disease and will improve without treatment. It can often be observed and treated at home. Your health care provider will do a physical exam and possibly order blood tests and X-rays to help determine the seriousness of your pain. However, in many cases, more time must pass before a clear cause of the pain can be found. Before that point, your health care provider may not know if you need more testing or further treatment. °HOME CARE INSTRUCTIONS  °Monitor your abdominal pain for any changes. The following actions may help to alleviate any discomfort you are experiencing: °· Only take over-the-counter or prescription medicines as directed by your health care provider. °· Do not take laxatives unless directed to do so by your health care provider. °· Try a clear liquid diet (broth, tea, or water) as directed by your health care provider. Slowly move to a bland diet as tolerated. °SEEK MEDICAL CARE IF: °· You have unexplained abdominal pain. °· You have abdominal pain associated with nausea or diarrhea. °· You have pain when you urinate or have a bowel movement. °· You experience abdominal pain that wakes you in the night. °· You have abdominal pain that is worsened or improved by eating food. °· You have abdominal pain that is worsened with eating fatty foods. °· You have a fever. °SEEK IMMEDIATE MEDICAL CARE IF:  °· Your pain does not go away within 2 hours. °· You keep throwing up (vomiting). °· Your pain is felt only in portions of the abdomen, such as the right side or the left lower portion of the abdomen. °· You pass bloody or black tarry stools. °MAKE SURE YOU: °· Understand these instructions.   °· Will watch your condition.   °· Will get help right away if you are not doing well or get worse.   °Document Released: 01/10/2005 Document Revised: 04/07/2013 Document Reviewed: 12/10/2012 °ExitCare® Patient Information  ©2015 [employer]. This information is not intended to replace advice given to you by your health care provider. Make sure you discuss any questions you have with your health care provider. ° °Diarrhea °Diarrhea is frequent loose and watery bowel movements. It can cause you to feel weak and dehydrated. Dehydration can cause you to become tired and thirsty, have a dry mouth, and have decreased urination that often is dark yellow. Diarrhea is a sign of another problem, most often an infection that will not last long. In most cases, diarrhea typically lasts 2-3 days. However, it can last longer if it is a sign of something more serious. It is important to treat your diarrhea as directed by your caregiver to lessen or prevent future episodes of diarrhea. °CAUSES  °Some common causes include: °· Gastrointestinal infections caused by viruses, bacteria, or parasites. °· Food poisoning or food allergies. °· Certain medicines, such as antibiotics, chemotherapy, and laxatives. °· Artificial sweeteners and fructose. °· Digestive disorders. °HOME CARE INSTRUCTIONS °· Ensure adequate fluid intake (hydration): Have 1 cup (8 oz) of fluid for each diarrhea episode. Avoid fluids that contain simple sugars or sports drinks, fruit juices, whole milk products, and sodas. Your urine should be clear or pale yellow if you are drinking enough fluids. Hydrate with an oral rehydration solution that you can purchase at pharmacies, retail stores, and online. You can prepare an oral rehydration solution at home by mixing the following ingredients together: °¨  - tsp table salt. °¨ ¾ tsp baking soda. °¨    can purchase at pharmacies, retail stores, and online. You can prepare an oral rehydration solution at home by mixing the following ingredients together:    - tsp table salt.    tsp baking soda.    tsp salt substitute containing potassium chloride.   1  tablespoons sugar.   1 L (34 oz) of water.   Certain foods and beverages may increase the speed at which food moves through the gastrointestinal (GI) tract. These foods and beverages should be avoided and include:   Caffeinated and alcoholic beverages.   High-fiber foods, such as raw  fruits and vegetables, nuts, seeds, and whole grain breads and cereals.   Foods and beverages sweetened with sugar alcohols, such as xylitol, sorbitol, and mannitol.   Some foods may be well tolerated and may help thicken stool including:   Starchy foods, such as rice, toast, pasta, low-sugar cereal, oatmeal, grits, baked potatoes, crackers, and bagels.   Bananas.   Applesauce.   Add probiotic-rich foods to help increase healthy bacteria in the GI tract, such as yogurt and fermented milk products.   Wash your hands well after each diarrhea episode.   Only take over-the-counter or prescription medicines as directed by your caregiver.   Take a warm bath to relieve any burning or pain from frequent diarrhea episodes.  SEEK IMMEDIATE MEDICAL CARE IF:    You are unable to keep fluids down.   You have persistent vomiting.   You have blood in your stool, or your stools are black and tarry.   You do not urinate in 6-8 hours, or there is only a small amount of very dark urine.   You have abdominal pain that increases or localizes.   You have weakness, dizziness, confusion, or light-headedness.   You have a severe headache.   Your diarrhea gets worse or does not get better.   You have a fever or persistent symptoms for more than 2-3 days.   You have a fever and your symptoms suddenly get worse.  MAKE SURE YOU:    Understand these instructions.   Will watch your condition.   Will get help right away if you are not doing well or get worse.  Document Released: 03/23/2002 Document Revised: 08/17/2013 Document Reviewed: 12/09/2011  ExitCare Patient Information 2015 [employer]. This information is not intended to replace advice given to you by your health care provider. Make sure you discuss any questions you have with your health care provider.  Nausea and Vomiting  Nausea is a sick feeling that often comes before throwing up (vomiting). Vomiting is a reflex where stomach contents come out of your mouth.  Vomiting can cause severe loss of body fluids (dehydration). Children and elderly adults can become dehydrated quickly, especially if they also have diarrhea. Nausea and vomiting are symptoms of a condition or disease. It is important to find the cause of your symptoms.  CAUSES    Direct irritation of the stomach lining. This irritation can result from increased acid production (gastroesophageal reflux disease), infection, food poisoning, taking certain medicines (such as nonsteroidal anti-inflammatory drugs), alcohol use, or tobacco use.   Signals from the brain.These signals could be caused by a headache, heat exposure, an inner ear disturbance, increased pressure in the brain from injury, infection, a tumor, or a concussion, pain, emotional stimulus, or metabolic problems.   An obstruction in the gastrointestinal tract (bowel obstruction).   Illnesses such as diabetes, hepatitis, gallbladder problems, appendicitis, kidney problems, cancer, sepsis, atypical symptoms of a   heart attack, or eating disorders.   Medical treatments such as chemotherapy and radiation.   Receiving medicine that makes you sleep (general anesthetic) during surgery.  DIAGNOSIS  Your caregiver may ask for tests to be done if the problems do not improve after a few days. Tests may also be done if symptoms are severe or if the reason for the nausea and vomiting is not clear. Tests may include:   Urine tests.   Blood tests.   Stool tests.   Cultures (to look for evidence of infection).   X-rays or other imaging studies.  Test results can help your caregiver make decisions about treatment or the need for additional tests.  TREATMENT  You need to stay well hydrated. Drink frequently but in small amounts.You may wish to drink water, sports drinks, clear broth, or eat frozen ice pops or gelatin dessert to help stay hydrated.When you eat, eating slowly may help prevent nausea.There are also some antinausea medicines that may help  prevent nausea.  HOME CARE INSTRUCTIONS    Take all medicine as directed by your caregiver.   If you do not have an appetite, do not force yourself to eat. However, you must continue to drink fluids.   If you have an appetite, eat a normal diet unless your caregiver tells you differently.   Eat a variety of complex carbohydrates (rice, wheat, potatoes, bread), lean meats, yogurt, fruits, and vegetables.   Avoid high-fat foods because they are more difficult to digest.   Drink enough water and fluids to keep your urine clear or pale yellow.   If you are dehydrated, ask your caregiver for specific rehydration instructions. Signs of dehydration may include:   Severe thirst.   Dry lips and mouth.   Dizziness.   Dark urine.   Decreasing urine frequency and amount.   Confusion.   Rapid breathing or pulse.  SEEK IMMEDIATE MEDICAL CARE IF:    You have blood or brown flecks (like coffee grounds) in your vomit.   You have black or bloody stools.   You have a severe headache or stiff neck.   You are confused.   You have severe abdominal pain.   You have chest pain or trouble breathing.   You do not urinate at least once every 8 hours.   You develop cold or clammy skin.   You continue to vomit for longer than 24 to 48 hours.   You have a fever.  MAKE SURE YOU:    Understand these instructions.   Will watch your condition.   Will get help right away if you are not doing well or get worse.  Document Released: 04/02/2005 Document Revised: 06/25/2011 Document Reviewed: 08/30/2010  ExitCare Patient Information 2015 [employer]. This information is not intended to replace advice given to you by your health care provider. Make sure you discuss any questions you have with your health care provider.

## 2013-12-12 NOTE — ED Notes (Signed)
PT  Given ginger ale.

## 2013-12-15 LAB — PATHOLOGIST SMEAR REVIEW

## 2014-05-09 ENCOUNTER — Other Ambulatory Visit: Payer: Self-pay | Admitting: Obstetrics & Gynecology

## 2014-05-28 ENCOUNTER — Encounter: Payer: Self-pay | Admitting: Obstetrics & Gynecology

## 2014-05-28 ENCOUNTER — Ambulatory Visit (INDEPENDENT_AMBULATORY_CARE_PROVIDER_SITE_OTHER): Payer: 59 | Admitting: Obstetrics & Gynecology

## 2014-05-28 VITALS — BP 137/94 | HR 90 | Ht 67.5 in | Wt 223.6 lb

## 2014-05-28 DIAGNOSIS — Z1151 Encounter for screening for human papillomavirus (HPV): Secondary | ICD-10-CM

## 2014-05-28 DIAGNOSIS — Z113 Encounter for screening for infections with a predominantly sexual mode of transmission: Secondary | ICD-10-CM

## 2014-05-28 DIAGNOSIS — Z01419 Encounter for gynecological examination (general) (routine) without abnormal findings: Secondary | ICD-10-CM

## 2014-05-28 DIAGNOSIS — Z124 Encounter for screening for malignant neoplasm of cervix: Secondary | ICD-10-CM

## 2014-05-28 DIAGNOSIS — Z0000 Encounter for general adult medical examination without abnormal findings: Secondary | ICD-10-CM

## 2014-05-28 DIAGNOSIS — Z3041 Encounter for surveillance of contraceptive pills: Secondary | ICD-10-CM

## 2014-05-28 NOTE — Progress Notes (Signed)
Subjective:    Kristin [name] is a 34 y.o. MW G12 female who presents for an annual exam. The patient has no complaints today. The patient is sexually active. GYN screening history: last pap: was normal. The patient wears seatbelts: yes. The patient participates in regular exercise: yes. Has the patient ever been transfused or tattooed?: yes. The patient reports that there is not domestic violence in her life.   Menstrual History: OB History    Gravida Para Term Preterm AB TAB SAB Ectopic Multiple Living   '0 0 0 0 0 0 0 0 0 0 '      Menarche age: 53  Patient's last menstrual period was 05/09/2014 (approximate).    The following portions of the patient's history were reviewed and updated as appropriate: allergies, current medications, past family history, past medical history, past social history, past surgical history and problem list.  Review of Systems Pertinent items are noted in HPI. She is happy with OCPs for contraception.   Objective:    BP 137/94 mmHg  Pulse 90  Ht 5' 7.5" (1.715 m)  Wt 223 lb 9.6 oz (101.424 kg)  BMI 34.48 kg/m2  LMP 05/09/2014 (Approximate)  General Appearance:    Alert, cooperative, no distress, appears stated age  Head:    Normocephalic, without obvious abnormality, atraumatic  Eyes:    PERRL, conjunctiva/corneas clear, EOM's intact, fundi    benign, both eyes  Ears:    Normal TM's and external ear canals, both ears  Nose:   Nares normal, septum midline, mucosa normal, no drainage    or sinus tenderness  Throat:   Lips, mucosa, and tongue normal; teeth and gums normal  Neck:   Supple, symmetrical, trachea midline, no adenopathy;    thyroid:  no enlargement/tenderness/nodules; no carotid   bruit or JVD  Back:     Symmetric, no curvature, ROM normal, no CVA tenderness  Lungs:     Clear to auscultation bilaterally, respirations unlabored  Chest Wall:    No tenderness or deformity   Heart:    Regular rate and rhythm, S1 and S2 normal, no murmur, rub    or gallop  Breast Exam:    No tenderness, masses, or nipple abnormality, She continues to have a 1-2 cm mobile lump of the right breast at the 11 o'clock position just outside of the areola. I have sent her for a biopsy in the past that was negative. Her mother had breast cancer and was BRCA negative  Abdomen:     Soft, non-tender, bowel sounds active all four quadrants,    no masses, no organomegaly  Genitalia:    Normal female without lesion, discharge or tenderness, NSSA, NT, normal adnexal exam     Extremities:   Extremities normal, atraumatic, no cyanosis or edema  Pulses:   2+ and symmetric all extremities  Skin:   Skin color, texture, turgor normal, no rashes or lesions  Lymph nodes:   Cervical, supraclavicular, and axillary nodes normal  Neurologic:   CNII-XII intact, normal strength, sensation and reflexes    throughout  .    Assessment:    Healthy female exam.    Plan:     Breast self exam technique reviewed and patient encouraged to perform self-exam monthly. Thin prep Pap smear. with cotesting Continue working on weight loss Rec MVI daily

## 2014-05-28 NOTE — Addendum Note (Signed)
Addended by: Erik Obey on: 05/28/2014 11:12 AM   Modules accepted: Orders

## 2014-06-02 LAB — CYTOLOGY - PAP

## 2014-06-09 ENCOUNTER — Other Ambulatory Visit: Payer: Self-pay | Admitting: Obstetrics & Gynecology

## 2014-06-09 DIAGNOSIS — Z3049 Encounter for surveillance of other contraceptives: Secondary | ICD-10-CM

## 2014-06-10 NOTE — Telephone Encounter (Signed)
Pt called and needed refills on her birth control.  I have sent in refills to her pharmacy.

## 2014-09-16 ENCOUNTER — Telehealth: Payer: Self-pay | Admitting: *Deleted

## 2014-09-16 DIAGNOSIS — Z803 Family history of malignant neoplasm of breast: Secondary | ICD-10-CM

## 2014-09-16 NOTE — Telephone Encounter (Signed)
Patients family doctor recommended her to have a mammogram due to her mothers recurring breast cancer and BRCA positive status.

## 2014-09-28 ENCOUNTER — Ambulatory Visit
Admission: RE | Admit: 2014-09-28 | Discharge: 2014-09-28 | Disposition: A | Discharge status: Home / Self Care | Payer: 59 | Source: Ambulatory Visit | Attending: Obstetrics & Gynecology | Admitting: Obstetrics & Gynecology

## 2014-09-28 DIAGNOSIS — Z803 Family history of malignant neoplasm of breast: Secondary | ICD-10-CM

## 2014-09-30 ENCOUNTER — Other Ambulatory Visit: Payer: Self-pay | Admitting: Obstetrics & Gynecology

## 2014-09-30 DIAGNOSIS — R928 Other abnormal and inconclusive findings on diagnostic imaging of breast: Secondary | ICD-10-CM

## 2014-10-06 ENCOUNTER — Ambulatory Visit
Admission: RE | Admit: 2014-10-06 | Discharge: 2014-10-06 | Disposition: A | Discharge status: Home / Self Care | Payer: 59 | Source: Ambulatory Visit | Attending: Obstetrics & Gynecology | Admitting: Obstetrics & Gynecology

## 2014-10-06 DIAGNOSIS — R928 Other abnormal and inconclusive findings on diagnostic imaging of breast: Secondary | ICD-10-CM

## 2015-01-04 ENCOUNTER — Other Ambulatory Visit: Payer: Self-pay | Admitting: Family Medicine

## 2015-01-04 ENCOUNTER — Ambulatory Visit
Admission: RE | Admit: 2015-01-04 | Discharge: 2015-01-04 | Disposition: A | Discharge status: Home / Self Care | Payer: 59 | Source: Ambulatory Visit | Attending: Family Medicine | Admitting: Family Medicine

## 2015-04-20 ENCOUNTER — Telehealth: Payer: Self-pay | Admitting: *Deleted

## 2015-04-20 DIAGNOSIS — Z3049 Encounter for surveillance of other contraceptives: Secondary | ICD-10-CM

## 2015-04-20 MED ORDER — NORETHIN ACE-ETH ESTRAD-FE 1-20 MG-MCG PO TABS: 1.0000 | ORAL_TABLET | Freq: Every day | ORAL | Status: DC

## 2015-04-20 NOTE — Telephone Encounter (Signed)
-----   Message from Francia Greaves sent at 04/20/2015  8:19 AM EST ----- Regarding: Rx Contact: [phone] Needs 1 refill on her birth control to get her through until her annual exam, scheduled for 02/13 Uses [facility] in [facility]

## 2015-04-20 NOTE — Telephone Encounter (Signed)
I have sent in refills to [place] [place] [facility].

## 2015-05-12 ENCOUNTER — Other Ambulatory Visit: Payer: Self-pay | Admitting: Obstetrics & Gynecology

## 2015-05-30 ENCOUNTER — Encounter: Payer: Self-pay | Admitting: Obstetrics & Gynecology

## 2015-05-30 ENCOUNTER — Ambulatory Visit (INDEPENDENT_AMBULATORY_CARE_PROVIDER_SITE_OTHER): Payer: 59 | Admitting: Obstetrics & Gynecology

## 2015-05-30 VITALS — BP 128/87 | HR 81 | Ht 67.0 in | Wt 229.0 lb

## 2015-05-30 DIAGNOSIS — Z124 Encounter for screening for malignant neoplasm of cervix: Secondary | ICD-10-CM

## 2015-05-30 DIAGNOSIS — N63 Unspecified lump in breast: Secondary | ICD-10-CM | POA: Diagnosis not present

## 2015-05-30 DIAGNOSIS — Z3041 Encounter for surveillance of contraceptive pills: Secondary | ICD-10-CM | POA: Diagnosis not present

## 2015-05-30 DIAGNOSIS — Z01419 Encounter for gynecological examination (general) (routine) without abnormal findings: Secondary | ICD-10-CM | POA: Diagnosis not present

## 2015-05-30 DIAGNOSIS — Z1151 Encounter for screening for human papillomavirus (HPV): Secondary | ICD-10-CM | POA: Diagnosis not present

## 2015-05-30 DIAGNOSIS — N6311 Unspecified lump in the right breast, upper outer quadrant: Secondary | ICD-10-CM

## 2015-05-30 DIAGNOSIS — Z3049 Encounter for surveillance of other contraceptives: Secondary | ICD-10-CM

## 2015-05-30 DIAGNOSIS — Z0000 Encounter for general adult medical examination without abnormal findings: Secondary | ICD-10-CM

## 2015-05-30 MED ORDER — NORETHIN ACE-ETH ESTRAD-FE 1-20 MG-MCG PO TABS: 1.0000 | ORAL_TABLET | Freq: Every day | ORAL | Status: DC

## 2015-05-30 NOTE — Progress Notes (Signed)
Subjective:    Kristin [name] is a 35 y.o. female who presents for an annual exam. The patient has no complaints today. She is frustrated by her weight. She saw an endocrinologist who says that she is normal. She uses a Physiological scientist 3 times per week, she tried phenteramine with no help, increased her BP. The patient is sexually active. GYN screening history: last pap: was normal. The patient wears seatbelts: yes. The patient participates in regular exercise: yes. Has the patient ever been transfused or tattooed?: yes. The patient reports that there is not domestic violence in her life.   Menstrual History: OB History    Gravida Para Term Preterm AB TAB SAB Ectopic Multiple Living   _0      Menarche age: 44 Patient's last menstrual period was 05/07/2015.    The following portions of the patient's history were reviewed and updated as appropriate: allergies, current medications, past family history, past medical history, past social history, past surgical history and problem list.  Review of Systems Pertinent items noted in HPI and remainder of comprehensive ROS otherwise negative.  Periods are sometimes irregular, sometimes skips periods. Married for about 1 1/2 years, denies dyspareunia, on OCPs. Wants a pregnancy sometime this year.   Objective:    BP 128/87 mmHg  Pulse 81  Ht _1  (1.702 m)  Wt 229 lb (103.874 kg)  BMI 35.86 kg/m2  LMP 05/07/2015  General Appearance:    Alert, cooperative, no distress, appears stated age  Head:    Normocephalic, without obvious abnormality, atraumatic  Eyes:    PERRL, conjunctiva/corneas clear, EOM's intact, fundi    benign, both eyes  Ears:    Normal TM's and external ear canals, both ears  Nose:   Nares normal, septum midline, mucosa normal, no drainage    or sinus tenderness  Throat:   Lips, mucosa, and tongue normal; teeth and gums normal  Neck:   Supple, symmetrical, trachea midline, no adenopathy;    thyroid:  no  enlargement/tenderness/nodules; no carotid   bruit or JVD  Back:     Symmetric, no curvature, ROM normal, no CVA tenderness  Lungs:     Clear to auscultation bilaterally, respirations unlabored  Chest Wall:    No tenderness or deformity   Heart:    Regular rate and rhythm, S1 and S2 normal, no murmur, rub   or gallop  Breast Exam:    Bilaterally dense breasts, right breast with 1 cm mass at the 10 o'clock position, no nipple discharge or skin changes  Abdomen:     Soft, non-tender, bowel sounds active all four quadrants,    no masses, no organomegaly  Genitalia:    Normal female without lesion, discharge or tenderness     Extremities:   Extremities normal, atraumatic, no cyanosis or edema  Pulses:   2+ and symmetric all extremities  Skin:   Skin color, texture, turgor normal, no rashes or lesions  Lymph nodes:   Cervical, supraclavicular, and axillary nodes normal  Neurologic:   CNII-XII intact, normal strength, sensation and reflexes    throughout  .    Assessment:    Healthy female exam.   Right breast lump- her mother has breast ca, BRCA negative   Plan:     Breast self exam technique reviewed and patient encouraged to perform self-exam monthly. Thin prep Pap smear.   She is concerned that she may be getting early menopause. She  will come in for [facility] level when she has been off of OCPs for at least a week. Check diagnostic mammogram

## 2015-05-30 NOTE — Addendum Note (Signed)
Addended by: Tarry Kos on: 05/30/2015 01:45 PM   Modules accepted: Orders

## 2015-06-01 LAB — CYTOLOGY - PAP

## 2015-06-06 ENCOUNTER — Ambulatory Visit
Admission: RE | Admit: 2015-06-06 | Discharge: 2015-06-06 | Disposition: A | Discharge status: Home / Self Care | Payer: 59 | Source: Ambulatory Visit | Attending: Obstetrics & Gynecology | Admitting: Obstetrics & Gynecology

## 2015-06-06 DIAGNOSIS — N6311 Unspecified lump in the right breast, upper outer quadrant: Secondary | ICD-10-CM

## 2015-11-21 ENCOUNTER — Encounter (HOSPITAL_BASED_OUTPATIENT_CLINIC_OR_DEPARTMENT_OTHER): Payer: Self-pay | Admitting: Emergency Medicine

## 2015-11-21 ENCOUNTER — Emergency Department (HOSPITAL_BASED_OUTPATIENT_CLINIC_OR_DEPARTMENT_OTHER)
Admission: EM | Admit: 2015-11-21 | Discharge: 2015-11-21 | Disposition: A | Discharge status: Home / Self Care | Payer: 59 | Attending: Emergency Medicine | Admitting: Emergency Medicine

## 2015-11-21 ENCOUNTER — Emergency Department (HOSPITAL_BASED_OUTPATIENT_CLINIC_OR_DEPARTMENT_OTHER): Payer: 59

## 2015-11-21 DIAGNOSIS — Y939 Activity, unspecified: Secondary | ICD-10-CM | POA: Diagnosis not present

## 2015-11-21 DIAGNOSIS — Z79899 Other long term (current) drug therapy: Secondary | ICD-10-CM | POA: Diagnosis not present

## 2015-11-21 DIAGNOSIS — F17210 Nicotine dependence, cigarettes, uncomplicated: Secondary | ICD-10-CM | POA: Diagnosis not present

## 2015-11-21 DIAGNOSIS — Y92009 Unspecified place in unspecified non-institutional (private) residence as the place of occurrence of the external cause: Secondary | ICD-10-CM | POA: Diagnosis not present

## 2015-11-21 DIAGNOSIS — W108XXA Fall (on) (from) other stairs and steps, initial encounter: Secondary | ICD-10-CM | POA: Insufficient documentation

## 2015-11-21 DIAGNOSIS — S3992XA Unspecified injury of lower back, initial encounter: Secondary | ICD-10-CM | POA: Insufficient documentation

## 2015-11-21 DIAGNOSIS — Y999 Unspecified external cause status: Secondary | ICD-10-CM | POA: Insufficient documentation

## 2015-11-21 LAB — PREGNANCY, URINE: Preg Test, Ur: NEGATIVE

## 2015-11-21 MED ORDER — POLYETHYLENE GLYCOL 3350 17 GM/SCOOP PO POWD: 17.0000 g | Freq: Two times a day (BID) | ORAL | 0 refills | Status: DC

## 2015-11-21 MED ORDER — OXYCODONE-ACETAMINOPHEN 5-325 MG PO TABS
1.0000 | ORAL_TABLET | Freq: Once | ORAL | Status: AC
  Administered 2015-11-21: 1 via ORAL
  Filled 2015-11-21: qty 1

## 2015-11-21 MED ORDER — ONDANSETRON HCL 4 MG PO TABS: 4.0000 mg | ORAL_TABLET | Freq: Three times a day (TID) | ORAL | 0 refills | Status: DC | PRN

## 2015-11-21 MED ORDER — ONDANSETRON 4 MG PO TBDP
4.0000 mg | ORAL_TABLET | Freq: Once | ORAL | Status: AC
  Administered 2015-11-21: 4 mg via ORAL
  Filled 2015-11-21: qty 1

## 2015-11-21 MED ORDER — OXYCODONE-ACETAMINOPHEN 5-325 MG PO TABS: 1.0000 | ORAL_TABLET | ORAL | 0 refills | Status: DC | PRN

## 2015-11-21 MED FILL — OXYCODONE/APAP 5-325: 5-325 | 2 days supply | Qty: 20 | Fill #0

## 2015-11-21 MED FILL — POLYETHYLENE GLYCOL 3350: 8 days supply | Qty: 255 | Fill #0

## 2015-11-21 MED FILL — ONDANSETRON HCL 4 MG TABLET: 4 | 3 days supply | Qty: 10 | Fill #0

## 2015-11-21 NOTE — ED Triage Notes (Signed)
Pt reports sacral pain from falling down the steps at home around 3 weeks ago. Denies LOC or hitting her head. Pt reports OTC meds and used 1 dose of husbands Vicodin. Gait quick and steady.pain most when sitting or lying.

## 2015-11-21 NOTE — ED Notes (Signed)
PA at bedside.

## 2015-11-21 NOTE — ED Provider Notes (Signed)
[facility] Provider Note   CSN: [id number] Arrival date & time: 11/21/15  1242  First Provider Contact:  First MD Initiated Contact with Patient 11/21/15 1319        History   Chief Complaint Chief Complaint  Patient presents with  . Fall  . Tailbone Pain    HPI Kristin [name] is a 35 y.o. female who presents emergency Department with chief complaint of tailbone pain. Patient states that she had an injury to her tailbone 3 weeks ago. She slipped in her foot flops and Maxi dressed onto her tailbone at the top of the stairs and then proceeded to fall on her bottom, all the way down. She's had significant pain in her tailbone. Since that time. She has pain with sitting and standing, she is pain with lying on her side in bed and crossing her hips. She has significant pain with bowel movements. She denies any urinary symptoms, she denies radiating pain down her legs, weakness, saddle anesthesia, loss of bowel or bladder control. She has tried icing and anti-inflammatory medications without significant relief.  HPI  Past Medical History:  Diagnosis Date  . Abnormal Pap smear of cervix 2008  . Arthritis   . Depression    migrane  . History of dysmenorrhea   . Migraine   . Ulcer    stomach  . Vein, varicose     There are no active problems to display for this patient.   Past Surgical History:  Procedure Laterality Date  . LEEP  2008  . WISDOM TOOTH EXTRACTION     x3    OB History    Gravida Para Term Preterm AB Living   0 0 0 0 0 0   SAB TAB Ectopic Multiple Live Births   0 0 0 0         Home Medications    Prior to Admission medications   Medication Sig Start Date End Date Taking? Authorizing Provider  amitriptyline (ELAVIL) 50 MG tablet Take 50 mg by mouth at bedtime.   Yes Historical Provider, MD  omeprazole (PRILOSEC) 40 MG capsule  05/19/11  Yes Historical Provider, MD  escitalopram (LEXAPRO) 10 MG tablet Take 10 mg by mouth daily. Reported on  05/30/2015    Historical Provider, MD  MICROGESTIN FE 1/20 1-20 MG-MCG tablet TAKE 1 TABLET BY MOUTH EVERY DAY 05/23/15   [name], MD  norethindrone-ethinyl estradiol (MICROGESTIN FE 1/20) 1-20 MG-MCG tablet Take 1 tablet by mouth daily. 05/30/15   [name], MD  ondansetron (ZOFRAN) 4 MG tablet Take 1 tablet (4 mg total) by mouth every 8 (eight) hours as needed for nausea or vomiting. 11/21/15   Margarita Mail, PA-C  oxyCODONE-acetaminophen (PERCOCET) 5-325 MG tablet Take 1-2 tablets by mouth every 4 (four) hours as needed. 11/21/15   Margarita Mail, PA-C  polyethylene glycol powder (GLYCOLAX/MIRALAX) powder Take 17 g by mouth 2 (two) times daily. 11/21/15   Margarita Mail, PA-C  topiramate (TOPAMAX) 100 MG tablet Take 100 mg by mouth.    Historical Provider, MD    Family History Family History  Problem Relation Age of Onset  . Cancer Mother     breast  . Cancer Maternal Grandmother     breast  . Depression Maternal Grandfather   . Depression Paternal Grandmother     Social History Social History  Substance Use Topics  . Smoking status: Current Every Day Smoker    Packs/day: 0.50    Types: Cigarettes  .  Smokeless tobacco: Never Used  . Alcohol use 4.2 - 6.0 oz/week    7 - 10 Standard drinks or equivalent per week     Comment: " a few drinks a night"     Allergies   Vicodin [hydrocodone-acetaminophen]   Review of Systems Review of Systems  Constitutional: Negative for chills and fever.  Skin: Negative for wound.  Neurological: Negative for weakness and numbness.     Physical Exam Updated Vital Signs BP 133/97 (BP Location: Right Arm)   Pulse 74   Temp 98.7 F (37.1 C) (Oral)   Resp 18   Ht 5\' 7"  (1.702 m)   Wt 104.3 kg   LMP 10/25/2015 Comment: Neg U Preg in ED   SpO2 100%   BMI 36.02 kg/m   Physical Exam  Constitutional: She is oriented to person, place, and time. She appears well-developed and well-nourished. No distress.  HENT:  Head: Normocephalic and  atraumatic.  Eyes: Conjunctivae are normal. No scleral icterus.  Neck: Normal range of motion.  Cardiovascular: Normal rate, regular rhythm and normal heart sounds.  Exam reveals no gallop and no friction rub.   No murmur heard. Pulmonary/Chest: Effort normal and breath sounds normal. No respiratory distress.  Abdominal: Soft. Bowel sounds are normal. She exhibits no distension and no mass. There is no tenderness. There is no guarding.  Musculoskeletal:       Back:  Neurological: She is alert and oriented to person, place, and time.  Skin: Skin is warm and dry. She is not diaphoretic.  Nursing note and vitals reviewed.    ED Treatments / Results  Labs (all labs ordered are listed, but only abnormal results are displayed) Labs Reviewed  PREGNANCY, URINE    EKG  EKG Interpretation None       Radiology Dg Lumbar Spine Complete  Result Date: 11/21/2015 CLINICAL DATA:  Low back pain for 3 weeks since falling down stairs. Initial encounter. EXAM: LUMBAR SPINE - COMPLETE 4+ VIEW COMPARISON:  None. FINDINGS: There is no evidence of lumbar spine fracture. Alignment is normal. Intervertebral disc spaces are maintained. IMPRESSION: Negative exam. Electronically Signed   By: Inge Rise M.D.   On: 11/21/2015 14:37   Dg Sacrum/coccyx  Result Date: 11/21/2015 CLINICAL DATA:  Sacral pain for 3 weeks since falling down stairs. Initial encounter. EXAM: SACRUM AND COCCYX - 2+ VIEW COMPARISON:  None. FINDINGS: There is no evidence of fracture or other focal bone lesions. IMPRESSION: Negative exam. Electronically Signed   By: Inge Rise M.D.   On: 11/21/2015 14:36    Procedures Procedures (including critical care time)  Medications Ordered in ED Medications  oxyCODONE-acetaminophen (PERCOCET/ROXICET) 5-325 MG per tablet 1 tablet (1 tablet Oral Given 11/21/15 1320)  ondansetron (ZOFRAN-ODT) disintegrating tablet 4 mg (4 mg Oral Given 11/21/15 1320)     Initial Impression /  Assessment and Plan / ED Course  I have reviewed the triage vital signs and the nursing notes.  Pertinent labs & imaging results that were available during my care of the patient were reviewed by me and considered in my medical decision making (see chart for details).  Clinical Course    Patient with tenderness to palpation of the coccyx, negative imaging. She is, however, extremely tender to palpation. Patient received pain medications, supportive care to include stool softeners, doughnut seeds at home. Patient appears safe for discharge. Discussed return precautions.  Final Clinical Impressions(s) / ED Diagnoses   Final diagnoses:  Tailbone injury, initial encounter  New Prescriptions Discharge Medication List as of 11/21/2015  3:19 PM    START taking these medications   Details  ondansetron (ZOFRAN) 4 MG tablet Take 1 tablet (4 mg total) by mouth every 8 (eight) hours as needed for nausea or vomiting., Starting Mon 11/21/2015, Print    oxyCODONE-acetaminophen (PERCOCET) 5-325 MG tablet Take 1-2 tablets by mouth every 4 (four) hours as needed., Starting Mon 11/21/2015, Print         [place], PA-C 11/21/15 1643    Veryl Speak, MD 11/22/15 0700

## 2016-02-20 ENCOUNTER — Encounter: Payer: Self-pay | Admitting: Obstetrics & Gynecology

## 2016-02-20 ENCOUNTER — Ambulatory Visit (INDEPENDENT_AMBULATORY_CARE_PROVIDER_SITE_OTHER): Payer: 59 | Admitting: Obstetrics & Gynecology

## 2016-02-20 VITALS — BP 124/79 | HR 110 | Wt 227.0 lb

## 2016-02-20 DIAGNOSIS — E669 Obesity, unspecified: Secondary | ICD-10-CM

## 2016-02-20 DIAGNOSIS — Z3401 Encounter for supervision of normal first pregnancy, first trimester: Secondary | ICD-10-CM

## 2016-02-20 DIAGNOSIS — Z3689 Encounter for other specified antenatal screening: Secondary | ICD-10-CM | POA: Diagnosis not present

## 2016-02-20 DIAGNOSIS — O99210 Obesity complicating pregnancy, unspecified trimester: Secondary | ICD-10-CM | POA: Insufficient documentation

## 2016-02-20 DIAGNOSIS — O99211 Obesity complicating pregnancy, first trimester: Secondary | ICD-10-CM

## 2016-02-20 NOTE — Progress Notes (Signed)
Bedside U/S shows IUP with FHT of 178 BPM and CRL is 28.34mm  GA is [redacted]w[redacted]d

## 2016-02-20 NOTE — Progress Notes (Signed)
  Subjective:    Kristin [name] is a 35 yo MW G1P0000 [redacted]w[redacted]d being seen today for her first obstetrical visit.  Her obstetrical history is significant for obesity. Patient does intend to breast feed. Pregnancy history fully reviewed.  Patient reports acid reflux.  Vitals:   02/20/16 1425  BP: 124/79  Pulse: (!) 110  Weight: 227 lb (103 kg)    HISTORY: OB History  Gravida Para Term Preterm AB Living  1 0 0 0 0 0  SAB TAB Ectopic Multiple Live Births  0 0 0 0      # Outcome Date GA Lbr Len/2nd Weight Sex Delivery Anes PTL Lv  1 Current              Past Medical History:  Diagnosis Date  . Abnormal Pap smear of cervix 2008  . Arthritis   . Depression    migrane  . History of dysmenorrhea   . Migraine   . Ulcer ([place])    stomach  . Vein, varicose    Past Surgical History:  Procedure Laterality Date  . LEEP  2008  . WISDOM TOOTH EXTRACTION     x3   Family History  Problem Relation Age of Onset  . Cancer Mother     breast  . Cancer Maternal Grandmother     breast  . Depression Maternal Grandfather   . Depression Paternal Grandmother   . Down syndrome Paternal Uncle      Exam    Uterus:     Pelvic Exam:    Perineum: No Hemorrhoids   Vulva: normal   Vagina:  normal mucosa   pH:    Cervix: anteverted   Adnexa: normal adnexa   Bony Pelvis: android  System: Breast:  normal appearance, no masses or tenderness   Skin: normal coloration and turgor, no rashes    Neurologic: oriented   Extremities: normal strength, tone, and muscle mass   HEENT PERRLA   Mouth/Teeth mucous membranes moist, pharynx normal without lesions   Neck supple   Cardiovascular: regular rate and rhythm   Respiratory:  appears well, vitals normal, no respiratory distress, acyanotic, normal RR, ear and throat exam is normal, neck free of mass or lymphadenopathy, chest clear, no wheezing, crepitations, rhonchi, normal symmetric air entry   Abdomen: soft, non-tender; bowel sounds normal; no  masses,  no organomegaly   Urinary: urethral meatus normal      Assessment:    Pregnancy: G1P0000 Patient Active Problem List   Diagnosis Date Noted  . Encounter for supervision of normal first pregnancy in first trimester 02/20/2016  . Obesity in pregnancy 02/20/2016        Plan:     Initial labs drawn. Prenatal vitamins. Problem list reviewed and updated. Genetic Screening discussed First Screen: ordered.  Ultrasound discussed; fetal survey: requested.  Follow up in 4 weeks.   Kristin [name] 02/20/2016

## 2016-02-21 LAB — PRENATAL PROFILE (SOLSTAS)
ANTIBODY SCREEN: NEGATIVE
BASOS ABS: 0 {cells}/uL (ref 0–200)
Basophils Relative: 0 %
EOS ABS: 135 {cells}/uL (ref 15–500)
EOS PCT: 1 %
HCT: 36.3 % (ref 35.0–45.0)
HEMOGLOBIN: 12 g/dL (ref 11.7–15.5)
HIV 1&2 Ab, 4th Generation: NONREACTIVE
Hepatitis B Surface Ag: NEGATIVE
LYMPHS PCT: 24 %
Lymphs Abs: 3240 cells/uL (ref 850–3900)
MCH: 28.1 pg (ref 27.0–33.0)
MCHC: 33.1 g/dL (ref 32.0–36.0)
MCV: 85 fL (ref 80.0–100.0)
MONOS PCT: 7 %
MPV: 9.2 fL (ref 7.5–12.5)
Monocytes Absolute: 945 cells/uL (ref 200–950)
NEUTROS ABS: 9180 {cells}/uL — AB (ref 1500–7800)
Neutrophils Relative %: 68 %
PLATELETS: 388 10*3/uL (ref 140–400)
RBC: 4.27 MIL/uL (ref 3.80–5.10)
RDW: 13.5 % (ref 11.0–15.0)
RH TYPE: NEGATIVE
RUBELLA: 10.3 {index} — AB (ref <0.90)
WBC: 13.5 10*3/uL — ABNORMAL HIGH (ref 3.8–10.8)

## 2016-02-21 LAB — HEMOGLOBIN A1C
HEMOGLOBIN A1C: 5.4 % (ref <5.7)
MEAN PLASMA GLUCOSE: 108 mg/dL

## 2016-02-21 LAB — GLUCOSE, RANDOM: GLUCOSE: 95 mg/dL (ref 65–99)

## 2016-02-21 LAB — GC/CHLAMYDIA PROBE AMP
CT PROBE, AMP APTIMA: NOT DETECTED
GC PROBE AMP APTIMA: NOT DETECTED

## 2016-02-22 LAB — URINE CULTURE

## 2016-02-22 LAB — CULTURE, OB URINE
COLONY COUNT: NO GROWTH
Organism ID, Bacteria: NO GROWTH

## 2016-02-24 ENCOUNTER — Encounter (HOSPITAL_COMMUNITY): Payer: Self-pay | Admitting: Obstetrics & Gynecology

## 2016-03-14 ENCOUNTER — Encounter (HOSPITAL_COMMUNITY): Payer: Self-pay

## 2016-03-14 ENCOUNTER — Ambulatory Visit (HOSPITAL_COMMUNITY)
Admission: RE | Admit: 2016-03-14 | Discharge: 2016-03-14 | Disposition: A | Discharge status: Home / Self Care | Payer: 59 | Source: Ambulatory Visit | Attending: Obstetrics & Gynecology | Admitting: Obstetrics & Gynecology

## 2016-03-14 ENCOUNTER — Other Ambulatory Visit: Payer: Self-pay | Admitting: Obstetrics & Gynecology

## 2016-03-14 DIAGNOSIS — Z3682 Encounter for antenatal screening for nuchal translucency: Secondary | ICD-10-CM | POA: Diagnosis not present

## 2016-03-14 DIAGNOSIS — O99211 Obesity complicating pregnancy, first trimester: Secondary | ICD-10-CM | POA: Diagnosis present

## 2016-03-14 DIAGNOSIS — Z3A12 12 weeks gestation of pregnancy: Secondary | ICD-10-CM | POA: Diagnosis not present

## 2016-03-14 DIAGNOSIS — Z3401 Encounter for supervision of normal first pregnancy, first trimester: Secondary | ICD-10-CM

## 2016-03-14 NOTE — Addendum Note (Signed)
Encounter addended by: Orland Dec on: 03/14/2016  2:52 PM<BR>    Actions taken: Imaging Exam ended

## 2016-03-19 ENCOUNTER — Ambulatory Visit (INDEPENDENT_AMBULATORY_CARE_PROVIDER_SITE_OTHER): Payer: 59 | Admitting: Obstetrics & Gynecology

## 2016-03-19 VITALS — BP 124/83 | HR 108 | Wt 224.0 lb

## 2016-03-19 DIAGNOSIS — Z3401 Encounter for supervision of normal first pregnancy, first trimester: Secondary | ICD-10-CM

## 2016-03-19 DIAGNOSIS — Z3402 Encounter for supervision of normal first pregnancy, second trimester: Secondary | ICD-10-CM

## 2016-03-19 MED ORDER — SUMATRIPTAN SUCCINATE 100 MG PO TABS: 100.0000 mg | ORAL_TABLET | Freq: Once | ORAL | 11 refills | Status: DC | PRN

## 2016-03-19 NOTE — Progress Notes (Signed)
   PRENATAL VISIT NOTE  Subjective:  Kristin [name] is a 35 y.o. MW G1P0000 at 109w2d being seen today for ongoing prenatal care.  She is currently monitored for the following issues for this high-risk pregnancy and has Encounter for supervision of normal first pregnancy in first trimester and Obesity in pregnancy on her problem list.  Patient reports return of her migraines.   . Vag. Bleeding: None.   . Denies leaking of fluid.   The following portions of the patient's history were reviewed and updated as appropriate: allergies, current medications, past family history, past medical history, past social history, past surgical history and problem list. Problem list updated.  Objective:   Vitals:   03/19/16 1430  BP: 124/83  Pulse: (!) 108  Weight: 224 lb (101.6 kg)    Fetal Status: Fetal Heart Rate (bpm): 164         General:  Alert, oriented and cooperative. Patient is in no acute distress.  Skin: Skin is warm and dry. No rash noted.   Cardiovascular: Normal heart rate noted  Respiratory: Normal respiratory effort, no problems with respiration noted  Abdomen: Soft, gravid, appropriate for gestational age.       Pelvic:  Cervical exam deferred        Extremities: Normal range of motion.  Edema: None  Mental Status: Normal mood and affect. Normal behavior. Normal judgment and thought content.   Assessment and Plan:  Pregnancy: G1P0000 at [redacted]w[redacted]d  1. Encounter for supervision of normal first pregnancy in second trimester  - [place] MFM OB COMP + 14 WK; Future  2. Migraines- imitrex prn  Preterm labor symptoms and general obstetric precautions including but not limited to vaginal bleeding, contractions, leaking of fluid and fetal movement were reviewed in detail with the patient. Please refer to After Visit Summary for other counseling recommendations.  Return in about 4 weeks (around 04/16/2016) for AFP at next visit.   [name], MD

## 2016-03-19 NOTE — Progress Notes (Signed)
Increase in HA's she does have a history of migraines

## 2016-03-20 ENCOUNTER — Encounter: Payer: Self-pay | Admitting: *Deleted

## 2016-03-22 ENCOUNTER — Other Ambulatory Visit (HOSPITAL_COMMUNITY): Payer: Self-pay

## 2016-04-16 NOTE — L&D Delivery Note (Signed)
Patient a 36 y/o G1P1 who presented ruptured this AM. She had prolonged 2nd stage with pushing and required vaccuum delivery due to recurrent decelerations.  Operative Delivery Note At 11:48 PM a viable female was delivered via Vaginal, Spontaneous Delivery.  Presentation: vertex; Position: Left,, Occiput,, Transverse; Station: +3.  Verbal consent: obtained from patient.  Risks and benefits discussed in detail.  Risks include, but are not limited to the risks of anesthesia, bleeding, infection, damage to maternal tissues, fetal cephalhematoma.  There is also the risk of inability to effect vaginal delivery of the head, or shoulder dystocia that cannot be resolved by established maneuvers, leading to the need for emergency cesarean section.  APGAR: 2,penidng ; weight pending .   Placenta status: delivered intact with gentle traction sent to pathology .   Cord: 3 vessell with the following complications: none.  Cord pH: pending  Anesthesia:  epdirual Instruments: bell vaccuum Episiotomy: None Lacerations: 3rd degree partial <50% involving external sphincter;Perineal Suture Repair: 3.0 vicryl monocryl Est. Blood Loss (mL):  250  Mom to postpartum.  Baby to Couplet care / Skin to Skin.  Jacquiline Doe 09/22/2016, 12:16 AM

## 2016-04-17 ENCOUNTER — Ambulatory Visit (INDEPENDENT_AMBULATORY_CARE_PROVIDER_SITE_OTHER): Payer: 59 | Admitting: Obstetrics & Gynecology

## 2016-04-17 VITALS — BP 120/74 | HR 109 | Temp 97.7°F | Wt 230.0 lb

## 2016-04-17 DIAGNOSIS — G43909 Migraine, unspecified, not intractable, without status migrainosus: Secondary | ICD-10-CM | POA: Insufficient documentation

## 2016-04-17 DIAGNOSIS — J069 Acute upper respiratory infection, unspecified: Secondary | ICD-10-CM

## 2016-04-17 DIAGNOSIS — Z3492 Encounter for supervision of normal pregnancy, unspecified, second trimester: Secondary | ICD-10-CM

## 2016-04-17 DIAGNOSIS — G43709 Chronic migraine without aura, not intractable, without status migrainosus: Secondary | ICD-10-CM

## 2016-04-17 DIAGNOSIS — B9789 Other viral agents as the cause of diseases classified elsewhere: Secondary | ICD-10-CM

## 2016-04-17 NOTE — Progress Notes (Signed)
   PRENATAL VISIT NOTE  Subjective:  Kristin [name] is a 36 y.o. G1P0000 at [redacted]w[redacted]d being seen today for ongoing prenatal care.  She is currently monitored for the following issues for this low-risk pregnancy and has Encounter for supervision of normal first pregnancy in first trimester; Obesity in pregnancy; and Migraines on her problem list.  Patient reports daily headaches.  Contractions: Not present. Vag. Bleeding: None.  Movement: Absent. Denies leaking of fluid.   The following portions of the patient's history were reviewed and updated as appropriate: allergies, current medications, past family history, past medical history, past social history, past surgical history and problem list. Problem list updated.  Objective:   Vitals:   04/17/16 1426  BP: 120/74  Pulse: (!) 109  Temp: 97.7 F (36.5 C)  Weight: 230 lb (104.3 kg)    Fetal Status: Fetal Heart Rate (bpm): 143   Movement: Absent     General:  Alert, oriented and cooperative. Patient is in no acute distress.  Skin: Skin is warm and dry. No rash noted.   Cardiovascular: Normal heart rate noted  Respiratory: Normal respiratory effort, no problems with respiration noted, CTAB  Abdomen: Soft, gravid, appropriate for gestational age. Pain/Pressure: Present     Pelvic:  Cervical exam deferred        Extremities: Normal range of motion.  Edema: Trace  Mental Status: Normal mood and affect. Normal behavior. Normal judgment and thought content.   Assessment and Plan:  Pregnancy: G1P0000 at [redacted]w[redacted]d  1. Normal pregnancy, second trimester -First scren normal - Alpha fetoprotein, maternal  2. Upper respiratory infection, viral -OTC meds recommended  3. Chronic migraine without aura without status migrainosus, not intractable -Referral to Laurine Blazer  Preterm labor symptoms and general obstetric precautions including but not limited to vaginal bleeding, contractions, leaking of fluid and fetal movement were reviewed in  detail with the patient. Please refer to After Visit Summary for other counseling recommendations.  Return in about 4 weeks (around 05/15/2016).   Guss Bunde, MD

## 2016-04-18 LAB — ALPHA FETOPROTEIN, MATERNAL
AFP: 37.5 ng/mL
Curr Gest Age: 17.4 weeks
MOM FOR AFP: 1.25
OPEN SPINA BIFIDA: NEGATIVE
Osb Risk: 1:5780 {titer}

## 2016-04-23 ENCOUNTER — Ambulatory Visit (HOSPITAL_COMMUNITY)
Admission: RE | Admit: 2016-04-23 | Discharge: 2016-04-23 | Disposition: A | Discharge status: Home / Self Care | Payer: 59 | Source: Ambulatory Visit | Attending: Obstetrics & Gynecology | Admitting: Obstetrics & Gynecology

## 2016-04-23 ENCOUNTER — Other Ambulatory Visit: Payer: Self-pay | Admitting: Obstetrics & Gynecology

## 2016-04-23 DIAGNOSIS — Z3A18 18 weeks gestation of pregnancy: Secondary | ICD-10-CM

## 2016-04-23 DIAGNOSIS — O99212 Obesity complicating pregnancy, second trimester: Secondary | ICD-10-CM

## 2016-04-23 DIAGNOSIS — O3412 Maternal care for benign tumor of corpus uteri, second trimester: Secondary | ICD-10-CM | POA: Diagnosis not present

## 2016-04-23 DIAGNOSIS — O3442 Maternal care for other abnormalities of cervix, second trimester: Secondary | ICD-10-CM | POA: Diagnosis present

## 2016-04-23 DIAGNOSIS — Z3689 Encounter for other specified antenatal screening: Secondary | ICD-10-CM

## 2016-04-23 DIAGNOSIS — D259 Leiomyoma of uterus, unspecified: Secondary | ICD-10-CM | POA: Diagnosis not present

## 2016-04-23 DIAGNOSIS — Z3402 Encounter for supervision of normal first pregnancy, second trimester: Secondary | ICD-10-CM

## 2016-05-10 ENCOUNTER — Other Ambulatory Visit (INDEPENDENT_AMBULATORY_CARE_PROVIDER_SITE_OTHER): Payer: 59

## 2016-05-10 DIAGNOSIS — R350 Frequency of micturition: Secondary | ICD-10-CM

## 2016-05-10 LAB — POCT URINALYSIS DIPSTICK
BILIRUBIN UA: NEGATIVE
Blood, UA: NEGATIVE
Glucose, UA: NEGATIVE
Ketones, UA: NEGATIVE
Leukocytes, UA: NEGATIVE
Nitrite, UA: NEGATIVE
Protein, UA: NEGATIVE
SPEC GRAV UA: 1.01
UROBILINOGEN UA: NEGATIVE
pH, UA: 5

## 2016-05-10 NOTE — Progress Notes (Signed)
Pt came in complaining of UTI symptoms. She left a urine sample. I dipped the urine and everything looked normal. I sent the urine out for a culture and said I would call her with the results.

## 2016-05-12 LAB — URINE CULTURE

## 2016-05-15 ENCOUNTER — Ambulatory Visit (INDEPENDENT_AMBULATORY_CARE_PROVIDER_SITE_OTHER): Payer: 59 | Admitting: Obstetrics and Gynecology

## 2016-05-15 VITALS — BP 127/77 | HR 105 | Wt 232.0 lb

## 2016-05-15 DIAGNOSIS — O2341 Unspecified infection of urinary tract in pregnancy, first trimester: Secondary | ICD-10-CM

## 2016-05-15 DIAGNOSIS — Z3401 Encounter for supervision of normal first pregnancy, first trimester: Secondary | ICD-10-CM

## 2016-05-15 DIAGNOSIS — Z9889 Other specified postprocedural states: Secondary | ICD-10-CM

## 2016-05-15 DIAGNOSIS — N390 Urinary tract infection, site not specified: Secondary | ICD-10-CM

## 2016-05-15 MED ORDER — CEPHALEXIN 500 MG PO CAPS: 500.0000 mg | ORAL_CAPSULE | Freq: Three times a day (TID) | ORAL | 0 refills | Status: DC

## 2016-05-15 MED ORDER — HYDROXYZINE PAMOATE 25 MG PO CAPS: 25.0000 mg | ORAL_CAPSULE | Freq: Three times a day (TID) | ORAL | 1 refills | Status: DC | PRN

## 2016-05-15 NOTE — Progress Notes (Signed)
Subjective:  Kristin [name] is a 36 y.o. G1P0000 at [redacted]w[redacted]d being seen today for ongoing prenatal care.  She is currently monitored for the following issues for this high-risk pregnancy and has Encounter for supervision of normal first pregnancy in first trimester; Obesity in pregnancy; Migraines; and History of loop electrical excision procedure (LEEP) on her problem list.  Patient reports no complaints.  Contractions: Not present. Vag. Bleeding: None.  Movement: Present. Denies leaking of fluid.   The following portions of the patient's history were reviewed and updated as appropriate: allergies, current medications, past family history, past medical history, past social history, past surgical history and problem list. Problem list updated.  Objective:   Vitals:   05/15/16 1449  BP: 127/77  Pulse: (!) 105  Weight: 232 lb (105.2 kg)    Fetal Status: Fetal Heart Rate (bpm): 153   Movement: Present     General:  Alert, oriented and cooperative. Patient is in no acute distress.  Skin: Skin is warm and dry. No rash noted.   Cardiovascular: Normal heart rate noted  Respiratory: Normal respiratory effort, no problems with respiration noted  Abdomen: Soft, gravid, appropriate for gestational age. Pain/Pressure: Present     Pelvic:  Cervical exam deferred        Extremities: Normal range of motion.  Edema: Trace  Mental Status: Normal mood and affect. Normal behavior. Normal judgment and thought content.   Urinalysis:      Assessment and Plan:  Pregnancy: G1P0000 at [redacted]w[redacted]d  1. Urinary tract infection without hematuria, site unspecified Keflex ordered Repeat UC at next OB visit  2. Encounter for supervision of normal first pregnancy in first trimester U/S findings reviewed with pt. Repeat U/S at 28 weeks  Order at next visit Grandmother passed away this past weekend, some anxiety Vistaril PRN anxiety.  3. History of loop electrical excision procedure (LEEP) Nl CL on U/S  Preterm  labor symptoms and general obstetric precautions including but not limited to vaginal bleeding, contractions, leaking of fluid and fetal movement were reviewed in detail with the patient. Please refer to After Visit Summary for other counseling recommendations.  Return in about 4 weeks (around 06/12/2016) for OB visit.   Chancy Milroy, MD

## 2016-05-15 NOTE — Patient Instructions (Signed)
Second Trimester of Pregnancy The second trimester is from week 13 through week 28 (months 4 through 6). The second trimester is often a time when you feel your best. Your body has also adjusted to being pregnant, and you begin to feel better physically. Usually, morning sickness has lessened or quit completely, you may have more energy, and you may have an increase in appetite. The second trimester is also a time when the fetus is growing rapidly. At the end of the sixth month, the fetus is about 9 inches long and weighs about 1 pounds. You will likely begin to feel the baby move (quickening) between 18 and 20 weeks of the pregnancy. Body changes during your second trimester Your body continues to go through many changes during your second trimester. The changes vary from woman to woman.  Your weight will continue to increase. You will notice your lower abdomen bulging out.  You may begin to get stretch marks on your hips, abdomen, and breasts.  You may develop headaches that can be relieved by medicines. The medicines should be approved by your health care provider.  You may urinate more often because the fetus is pressing on your bladder.  You may develop or continue to have heartburn as a result of your pregnancy.  You may develop constipation because certain hormones are causing the muscles that push waste through your intestines to slow down.  You may develop hemorrhoids or swollen, bulging veins (varicose veins).  You may have back pain. This is caused by:  Weight gain.  Pregnancy hormones that are relaxing the joints in your pelvis.  A shift in weight and the muscles that support your balance.  Your breasts will continue to grow and they will continue to become tender.  Your gums may bleed and may be sensitive to brushing and flossing.  Dark spots or blotches (chloasma, mask of pregnancy) may develop on your face. This will likely fade after the baby is born.  A dark line  from your belly button to the pubic area (linea nigra) may appear. This will likely fade after the baby is born.  You may have changes in your hair. These can include thickening of your hair, rapid growth, and changes in texture. Some women also have hair loss during or after pregnancy, or hair that feels dry or thin. Your hair will most likely return to normal after your baby is born. What to expect at prenatal visits During a routine prenatal visit:  You will be weighed to make sure you and the fetus are growing normally.  Your blood pressure will be taken.  Your abdomen will be measured to track your baby's growth.  The fetal heartbeat will be listened to.  Any test results from the previous visit will be discussed. Your health care provider may ask you:  How you are feeling.  If you are feeling the baby move.  If you have had any abnormal symptoms, such as leaking fluid, bleeding, severe headaches, or abdominal cramping.  If you are using any tobacco products, including cigarettes, chewing tobacco, and electronic cigarettes.  If you have any questions. Other tests that may be performed during your second trimester include:  Blood tests that check for:  Low iron levels (anemia).  Gestational diabetes (between 24 and 28 weeks).  Rh antibodies. This is to check for a protein on red blood cells (Rh factor).  Urine tests to check for infections, diabetes, or protein in the urine.  An ultrasound to  confirm the proper growth and development of the baby.  An amniocentesis to check for possible genetic problems.  Fetal screens for spina bifida and Down syndrome.  HIV (human immunodeficiency virus) testing. Routine prenatal testing includes screening for HIV, unless you choose not to have this test. Follow these instructions at home: Eating and drinking  Continue to eat regular, healthy meals.  Avoid raw meat, uncooked cheese, cat litter boxes, and soil used by cats. These  carry germs that can cause birth defects in the baby.  Take your prenatal vitamins.  Take 1500-2000 mg of calcium daily starting at the 20th week of pregnancy until you deliver your baby.  If you develop constipation:  Take over-the-counter or prescription medicines.  Drink enough fluid to keep your urine clear or pale yellow.  Eat foods that are high in fiber, such as fresh fruits and vegetables, whole grains, and beans.  Limit foods that are high in fat and processed sugars, such as fried and sweet foods. Activity  Exercise only as directed by your health care provider. Experiencing uterine cramps is a good sign to stop exercising.  Avoid heavy lifting, wear low heel shoes, and practice good posture.  Wear your seat belt at all times when driving.  Rest with your legs elevated if you have leg cramps or low back pain.  Wear a good support bra for breast tenderness.  Do not use hot tubs, steam rooms, or saunas. Lifestyle  Avoid all smoking, herbs, alcohol, and unprescribed drugs. These chemicals affect the formation and growth of the baby.  Do not use any products that contain nicotine or tobacco, such as cigarettes and e-cigarettes. If you need help quitting, ask your health care provider.  A sexual relationship may be continued unless your health care provider directs you otherwise. General instructions  Follow your health care provider's instructions regarding medicine use. There are medicines that are either safe or unsafe to take during pregnancy.  Take warm sitz baths to soothe any pain or discomfort caused by hemorrhoids. Use hemorrhoid cream if your health care provider approves.  If you develop varicose veins, wear support hose. Elevate your feet for 15 minutes, 3-4 times a day. Limit salt in your diet.  Visit your dentist if you have not gone yet during your pregnancy. Use a soft toothbrush to brush your teeth and be gentle when you floss.  Keep all follow-up  prenatal visits as told by your health care provider. This is important. Contact a health care provider if:  You have dizziness.  You have mild pelvic cramps, pelvic pressure, or nagging pain in the abdominal area.  You have persistent nausea, vomiting, or diarrhea.  You have a bad smelling vaginal discharge.  You have pain with urination. Get help right away if:  You have a fever.  You are leaking fluid from your vagina.  You have spotting or bleeding from your vagina.  You have severe abdominal cramping or pain.  You have rapid weight gain or weight loss.  You have shortness of breath with chest pain.  You notice sudden or extreme swelling of your face, hands, ankles, feet, or legs.  You have not felt your baby move in over an hour.  You have severe headaches that do not go away with medicine.  You have vision changes. Summary  The second trimester is from week 13 through week 28 (months 4 through 6). It is also a time when the fetus is growing rapidly.  Your body goes  through many changes during pregnancy. The changes vary from woman to woman.  Avoid all smoking, herbs, alcohol, and unprescribed drugs. These chemicals affect the formation and growth your baby.  Do not use any tobacco products, such as cigarettes, chewing tobacco, and e-cigarettes. If you need help quitting, ask your health care provider.  Contact your health care provider if you have any questions. Keep all prenatal visits as told by your health care provider. This is important. This information is not intended to replace advice given to you by your health care provider. Make sure you discuss any questions you have with your health care provider. Document Released: 03/27/2001 Document Revised: 09/08/2015 Document Reviewed: 06/03/2012 Elsevier Interactive Patient Education  2017 [employer].

## 2016-05-15 NOTE — Progress Notes (Signed)
Pt's urine culture results came back and she has an UTI. She also states that she is experiencing cramping.

## 2016-05-18 ENCOUNTER — Institutional Professional Consult (permissible substitution): Payer: 59 | Admitting: Physician Assistant

## 2016-06-12 ENCOUNTER — Ambulatory Visit (INDEPENDENT_AMBULATORY_CARE_PROVIDER_SITE_OTHER): Payer: 59 | Admitting: Obstetrics & Gynecology

## 2016-06-12 VITALS — BP 113/71 | HR 112 | Wt 235.0 lb

## 2016-06-12 DIAGNOSIS — O99212 Obesity complicating pregnancy, second trimester: Secondary | ICD-10-CM

## 2016-06-12 DIAGNOSIS — Z3401 Encounter for supervision of normal first pregnancy, first trimester: Secondary | ICD-10-CM

## 2016-06-12 DIAGNOSIS — Z3402 Encounter for supervision of normal first pregnancy, second trimester: Secondary | ICD-10-CM

## 2016-06-12 DIAGNOSIS — O359XX0 Maternal care for (suspected) fetal abnormality and damage, unspecified, not applicable or unspecified: Secondary | ICD-10-CM

## 2016-06-12 DIAGNOSIS — O99210 Obesity complicating pregnancy, unspecified trimester: Secondary | ICD-10-CM

## 2016-06-12 DIAGNOSIS — Z9889 Other specified postprocedural states: Secondary | ICD-10-CM

## 2016-06-12 DIAGNOSIS — R350 Frequency of micturition: Secondary | ICD-10-CM

## 2016-06-12 DIAGNOSIS — E669 Obesity, unspecified: Secondary | ICD-10-CM

## 2016-06-13 NOTE — Progress Notes (Signed)
   PRENATAL VISIT NOTE  Subjective:  Kristin [name] is a 36 y.o. G1P0000 at [redacted]w[redacted]d being seen today for ongoing prenatal care.  She is currently monitored for the following issues for this high-risk pregnancy and has Encounter for supervision of normal first pregnancy in first trimester; Obesity in pregnancy; Migraines; and History of loop electrical excision procedure (LEEP) on her problem list.  Patient reports frequent urination and feelings of baby low in vagina.  Contractions: Not present. Vag. Bleeding: None.  Movement: Present. Denies leaking of fluid.   The following portions of the patient's history were reviewed and updated as appropriate: allergies, current medications, past family history, past medical history, past social history, past surgical history and problem list. Problem list updated.  Objective:   Vitals:   06/12/16 1442  BP: 113/71  Pulse: (!) 112  Weight: 235 lb (106.6 kg)    Fetal Status: Fetal Heart Rate (bpm): 141   Movement: Present     General:  Alert, oriented and cooperative. Patient is in no acute distress.  Skin: Skin is warm and dry. No rash noted.   Cardiovascular: Normal heart rate noted  Respiratory: Normal respiratory effort, no problems with respiration noted  Abdomen: Soft, gravid, appropriate for gestational age. Pain/Pressure: Absent     Pelvic:  Cervical exam performed        Extremities: Normal range of motion.  Edema: Mild pitting, slight indentation  Mental Status: Normal mood and affect. Normal behavior. Normal judgment and thought content.   Assessment and Plan:  Pregnancy: G1P0000 at [redacted]w[redacted]d  1. Suspected fetal anomaly, antepartum, single or unspecified fetus -Pt wants MFM to talk to her before she leaves. - [place] [facility] OB FOLLOW UP; Future  2. Encounter for supervision of normal first pregnancy in first trimester 28 week labs and Tdap next visit  3. Frequent urination - Urine Culture  4. History of loop electrical excision procedure  (LEEP) Cervix is closed and long and high in pelvis.  5. Obesity in pregnancy Reviewed 12-20 pound weight gain.  Preterm labor symptoms and general obstetric precautions including but not limited to vaginal bleeding, contractions, leaking of fluid and fetal movement were reviewed in detail with the patient. Please refer to After Visit Summary for other counseling recommendations.  No Follow-up on file.   Guss Bunde, MD

## 2016-06-14 LAB — URINE CULTURE: ORGANISM ID, BACTERIA: NO GROWTH

## 2016-07-03 ENCOUNTER — Ambulatory Visit (HOSPITAL_COMMUNITY)
Admission: RE | Admit: 2016-07-03 | Discharge: 2016-07-03 | Disposition: A | Discharge status: Home / Self Care | Payer: 59 | Source: Ambulatory Visit | Attending: Obstetrics & Gynecology | Admitting: Obstetrics & Gynecology

## 2016-07-03 DIAGNOSIS — O99213 Obesity complicating pregnancy, third trimester: Secondary | ICD-10-CM | POA: Insufficient documentation

## 2016-07-03 DIAGNOSIS — Z3A28 28 weeks gestation of pregnancy: Secondary | ICD-10-CM | POA: Insufficient documentation

## 2016-07-03 DIAGNOSIS — Z362 Encounter for other antenatal screening follow-up: Secondary | ICD-10-CM | POA: Diagnosis not present

## 2016-07-03 DIAGNOSIS — D259 Leiomyoma of uterus, unspecified: Secondary | ICD-10-CM | POA: Insufficient documentation

## 2016-07-03 DIAGNOSIS — O3443 Maternal care for other abnormalities of cervix, third trimester: Secondary | ICD-10-CM | POA: Insufficient documentation

## 2016-07-03 DIAGNOSIS — O359XX0 Maternal care for (suspected) fetal abnormality and damage, unspecified, not applicable or unspecified: Secondary | ICD-10-CM

## 2016-07-03 DIAGNOSIS — O3412 Maternal care for benign tumor of corpus uteri, second trimester: Secondary | ICD-10-CM | POA: Diagnosis not present

## 2016-07-03 DIAGNOSIS — O283 Abnormal ultrasonic finding on antenatal screening of mother: Secondary | ICD-10-CM | POA: Diagnosis not present

## 2016-07-06 ENCOUNTER — Ambulatory Visit (INDEPENDENT_AMBULATORY_CARE_PROVIDER_SITE_OTHER): Payer: 59 | Admitting: Advanced Practice Midwife

## 2016-07-06 VITALS — BP 121/79 | HR 102 | Wt 241.0 lb

## 2016-07-06 DIAGNOSIS — Z3401 Encounter for supervision of normal first pregnancy, first trimester: Secondary | ICD-10-CM

## 2016-07-06 DIAGNOSIS — O360930 Maternal care for other rhesus isoimmunization, third trimester, not applicable or unspecified: Secondary | ICD-10-CM | POA: Diagnosis not present

## 2016-07-06 DIAGNOSIS — Z23 Encounter for immunization: Secondary | ICD-10-CM

## 2016-07-06 DIAGNOSIS — Z3490 Encounter for supervision of normal pregnancy, unspecified, unspecified trimester: Secondary | ICD-10-CM

## 2016-07-06 LAB — CBC
HCT: 34.9 % — ABNORMAL LOW (ref 35.0–45.0)
Hemoglobin: 11.3 g/dL — ABNORMAL LOW (ref 11.7–15.5)
MCH: 28.8 pg (ref 27.0–33.0)
MCHC: 32.4 g/dL (ref 32.0–36.0)
MCV: 89 fL (ref 80.0–100.0)
MPV: 8.7 fL (ref 7.5–12.5)
Platelets: 352 10*3/uL (ref 140–400)
RBC: 3.92 MIL/uL (ref 3.80–5.10)
RDW: 13.7 % (ref 11.0–15.0)
WBC: 12.8 10*3/uL — ABNORMAL HIGH (ref 3.8–10.8)

## 2016-07-06 MED ORDER — RHO D IMMUNE GLOBULIN 1500 UNIT/2ML IJ SOSY
300.0000 ug | PREFILLED_SYRINGE | Freq: Once | INTRAMUSCULAR | Status: AC
  Administered 2016-07-06: 300 ug via INTRAMUSCULAR

## 2016-07-06 NOTE — Progress Notes (Signed)
   PRENATAL VISIT NOTE  Subjective:  Kristin [name] is a 36 y.o. G1P0000 at [redacted]w[redacted]d being seen today for ongoing prenatal care.  She is currently monitored for the following issues for this low-risk pregnancy and has Encounter for supervision of normal first pregnancy in first trimester; Obesity in pregnancy; Migraines; and History of loop electrical excision procedure (LEEP) on her problem list.  Patient reports no complaints.  Contractions: Not present. Vag. Bleeding: None.  Movement: Present. Denies leaking of fluid.   The following portions of the patient's history were reviewed and updated as appropriate: allergies, current medications, past family history, past medical history, past social history, past surgical history and problem list. Problem list updated.  Objective:   Vitals:   07/06/16 0804  BP: 121/79  Pulse: (!) 102  Weight: 241 lb (109.3 kg)    Fetal Status: Fetal Heart Rate (bpm): 143 Fundal Height: 33 cm Movement: Present  Presentation: Vertex  General:  Alert, oriented and cooperative. Patient is in no acute distress.  Skin: Skin is warm and dry. No rash noted.   Cardiovascular: Normal heart rate noted  Respiratory: Normal respiratory effort, no problems with respiration noted  Abdomen: Soft, gravid, appropriate for gestational age. Pain/Pressure: Absent     Pelvic:  Cervical exam deferred        Extremities: Normal range of motion.  Edema: Mild pitting, slight indentation  Mental Status: Normal mood and affect. Normal behavior. Normal judgment and thought content.   Assessment and Plan:  Pregnancy: G1P0000 at [redacted]w[redacted]d  1. Normal pregnancy, unspecified trimester  - Glucose Tolerance, 2 Hours w/1 Hour - Antibody screen; Future - CBC - HIV antibody (with reflex) - RPR - rho (d) immune globulin (RHIG/RHOPHYLAC) injection 300 mcg; Inject 2 mLs (300 mcg total) into the muscle once. - Tdap vaccine greater than or equal to 7yo IM - Antibody screen  2. Encounter for  supervision of normal first pregnancy in first trimester   Preterm labor symptoms and general obstetric precautions including but not limited to vaginal bleeding, contractions, leaking of fluid and fetal movement were reviewed in detail with the patient. Please refer to After Visit Summary for other counseling recommendations.  Return in about 2 weeks (around 07/20/2016) for [place].  Planning waterbirth. Took class.   Manya Silvas, CNM

## 2016-07-06 NOTE — Patient Instructions (Addendum)
Preterm Labor and Birth Information The normal length of a pregnancy is 39-41 weeks. Preterm labor is when labor starts before 37 completed weeks of pregnancy. What are the risk factors for preterm labor? Preterm labor is more likely to occur in women who:  Have certain infections during pregnancy such as a bladder infection, sexually transmitted infection, or infection inside the uterus (chorioamnionitis).  Have a shorter-than-normal cervix.  Have gone into preterm labor before.  Have had surgery on their cervix.  Are younger than age 21 or older than age 72.  Are African American.  Are pregnant with twins or multiple babies (multiple gestation).  Take street drugs or smoke while pregnant.  Do not gain enough weight while pregnant.  Became pregnant shortly after having been pregnant. What are the symptoms of preterm labor? Symptoms of preterm labor include:  Cramps similar to those that can happen during a menstrual period. The cramps may happen with diarrhea.  Pain in the abdomen or lower back.  Regular uterine contractions that may feel like tightening of the abdomen.  A feeling of increased pressure in the pelvis.  Increased watery or bloody mucus discharge from the vagina.  Water breaking (ruptured amniotic sac). Why is it important to recognize signs of preterm labor? It is important to recognize signs of preterm labor because babies who are born prematurely may not be fully developed. This can put them at an increased risk for:  Long-term (chronic) heart and lung problems.  Difficulty immediately after birth with regulating body systems, including blood sugar, body temperature, heart rate, and breathing rate.  Bleeding in the brain.  Cerebral palsy.  Learning difficulties.  Death. These risks are highest for babies who are born before 20 weeks of pregnancy. How is preterm labor treated? Treatment depends on the length of your pregnancy, your condition, and  the health of your baby. It may involve:  Having a stitch (suture) placed in your cervix to prevent your cervix from opening too early (cerclage).  Taking or being given medicines, such as:  Hormone medicines. These may be given early in pregnancy to help support the pregnancy.  Medicine to stop contractions.  Medicines to help mature the baby's lungs. These may be prescribed if the risk of delivery is high.  Medicines to prevent your baby from developing cerebral palsy. If the labor happens before 34 weeks of pregnancy, you may need to stay in the hospital. What should I do if I think I am in preterm labor? If you think that you are going into preterm labor, call your health care provider right away. How can I prevent preterm labor in future pregnancies? To increase your chance of having a full-term pregnancy:  Do not use any tobacco products, such as cigarettes, chewing tobacco, and e-cigarettes. If you need help quitting, ask your health care provider.  Do not use street drugs or medicines that have not been prescribed to you during your pregnancy.  Talk with your health care provider before taking any herbal supplements, even if you have been taking them regularly.  Make sure you gain a healthy amount of weight during your pregnancy  .  Watch for infection. If you think that you might have an infection, get it checked right away.  Make sure to tell your health care provider if you have gone into preterm labor before. This information is not intended to replace advice given to you by your health care provider. Make sure you discuss any questions you have with  your health care provider. Document Released: 06/23/2003 Document Revised: 09/13/2015 Document Reviewed: 08/24/2015 Elsevier Interactive Patient Education  2017 [employer].   Tdap Vaccine (Tetanus, Diphtheria and Pertussis): What You Need to Know 1. Why get vaccinated? Tetanus, diphtheria and pertussis are very  serious diseases. Tdap vaccine can protect [place] from these diseases. And, Tdap vaccine given to pregnant women can protect newborn babies against pertussis. TETANUS (Lockjaw) is rare in the [place] today. It causes painful muscle tightening and stiffness, usually all over the body.  It can lead to tightening of muscles in the head and neck so you can't open your mouth, swallow, or sometimes even breathe. Tetanus kills about 1 out of 10 people who are infected even after receiving the best medical care. DIPHTHERIA is also rare in the [place] today. It can cause a thick coating to form in the back of the throat.  It can lead to breathing problems, heart failure, paralysis, and death. PERTUSSIS (Whooping Cough) causes severe coughing spells, which can cause difficulty breathing, vomiting and disturbed sleep.  It can also lead to weight loss, incontinence, and rib fractures. Up to 2 in 100 adolescents and 5 in 100 adults with pertussis are hospitalized or have complications, which could include pneumonia or death. These diseases are caused by bacteria. Diphtheria and pertussis are spread from person to person through secretions from coughing or sneezing. Tetanus enters the body through cuts, scratches, or wounds. Before vaccines, as many as 200,000 cases of diphtheria, 200,000 cases of pertussis, and hundreds of cases of tetanus, were reported in the [place] each year. Since vaccination began, reports of cases for tetanus and diphtheria have dropped by about 99% and for pertussis by about 80%. 2. Tdap vaccine Tdap vaccine can protect adolescents and adults from tetanus, diphtheria, and pertussis. One dose of Tdap is routinely given at age 68 or 46. People who did not get Tdap at that age should get it as soon as possible. Tdap is especially important for healthcare professionals and anyone having close contact with a baby younger than 12 months. Pregnant women should get a dose of Tdap  during every pregnancy, to protect the newborn from pertussis. Infants are most at risk for severe, life-threatening complications from pertussis. Another vaccine, called Td, protects against tetanus and diphtheria, but not pertussis. A Td booster should be given every 10 years. Tdap may be given as one of these boosters if you have never gotten Tdap before. Tdap may also be given after a severe cut or burn to prevent tetanus infection. Your doctor or the person giving you the vaccine can give you more information. Tdap may safely be given at the same time as other vaccines. 3. Some people should not get this vaccine  A person who has ever had a life-threatening allergic reaction after a previous dose of any diphtheria, tetanus or pertussis containing vaccine, OR has a severe allergy to any part of this vaccine, should not get Tdap vaccine. Tell the person giving the vaccine about any severe allergies.  Anyone who had coma or long repeated seizures within 7 days after a childhood dose of DTP or DTaP, or a previous dose of Tdap, should not get Tdap, unless a cause other than the vaccine was found. They can still get Td.  Talk to your doctor if you:  have seizures or another nervous system problem,  had severe pain or swelling after any vaccine containing diphtheria, tetanus or pertussis,  ever had a condition  called Guillain-Barr Syndrome (GBS),  aren't feeling well on the day the shot is scheduled. 4. Risks With any medicine, including vaccines, there is a chance of side effects. These are usually mild and go away on their own. Serious reactions are also possible but are rare. Most people who get Tdap vaccine do not have any problems with it. Mild problems following Tdap:  (Did not interfere with activities)  Pain where the shot was given (about 3 in 4 adolescents or 2 in 3 adults)  Redness or swelling where the shot was given (about 1 person in 5)  Mild fever of at least 100.25F (up to  about 1 in 25 adolescents or 1 in 100 adults)  Headache (about 3 or 4 people in 10)  Tiredness (about 1 person in 3 or 4)  Nausea, vomiting, diarrhea, stomach ache (up to 1 in 4 adolescents or 1 in 10 adults)  Chills, sore joints (about 1 person in 10)  Body aches (about 1 person in 3 or 4)  Rash, swollen glands (uncommon) Moderate problems following Tdap:  (Interfered with activities, but did not require medical attention)  Pain where the shot was given (up to 1 in 5 or 6)  Redness or swelling where the shot was given (up to about 1 in 16 adolescents or 1 in 12 adults)  Fever over 102F (about 1 in 100 adolescents or 1 in 250 adults)  Headache (about 1 in 7 adolescents or 1 in 10 adults)  Nausea, vomiting, diarrhea, stomach ache (up to 1 or 3 people in 100)  Swelling of the entire arm where the shot was given (up to about 1 in 500). Severe problems following Tdap:  (Unable to perform usual activities; required medical attention)  Swelling, severe pain, bleeding and redness in the arm where the shot was given (rare). Problems that could happen after any vaccine:   People sometimes faint after a medical procedure, including vaccination. Sitting or lying down for about 15 minutes can help prevent fainting, and injuries caused by a fall. Tell your doctor if you feel dizzy, or have vision changes or ringing in the ears.  Some people get severe pain in the shoulder and have difficulty moving the arm where a shot was given. This happens very rarely.  Any medication can cause a severe allergic reaction. Such reactions from a vaccine are very rare, estimated at fewer than 1 in a million doses, and would happen within a few minutes to a few hours after the vaccination. As with any medicine, there is a very remote chance of a vaccine causing a serious injury or death. The safety of vaccines is always being monitored. For more information, visit: http://www.aguilar.org/ 5. What if  there is a serious problem? What should I look for?  Look for anything that concerns you, such as signs of a severe allergic reaction, very high fever, or unusual behavior. Signs of a severe allergic reaction can include hives, swelling of the face and throat, difficulty breathing, a fast heartbeat, dizziness, and weakness. These would usually start a few minutes to a few hours after the vaccination. What should I do?   If you think it is a severe allergic reaction or other emergency that can't wait, call 9-1-1 or get the person to the nearest hospital. Otherwise, call your doctor.  Afterward, the reaction should be reported to the Vaccine Adverse Event Reporting System (VAERS). Your doctor might file this report, or you can do it yourself through the VAERS  web site at [employer].SamedayNews.es, or by calling (702)[phone].  VAERS does not give medical advice. 6. The National Vaccine Injury Compensation Program The [employer] Vaccine Injury Compensation Program (VICP) is a federal program that was created to compensate people who may have been injured by certain vaccines. Persons who believe they may have been injured by a vaccine can learn about the program and about filing a claim by calling (540)577-0247 or visiting the [place] website at GoldCloset.com.ee. There is a time limit to file a claim for compensation. 7. How can I learn more?  Ask your doctor. He or she can give you the vaccine package insert or suggest other sources of information.  Call your local or state health department.  Contact the Centers for Disease Control and Prevention (CDC):  Call 830-491-0537 (1-800-CDC-INFO) or  Visit CDC's website at http://hunter.com/ CDC Tdap Vaccine VIS (06/09/13) This information is not intended to replace advice given to you by your health care provider. Make sure you discuss any questions you have with your health care provider. Document Released: 10/02/2011 Document Revised:  12/22/2015 Document Reviewed: 12/22/2015 Elsevier Interactive Patient Education  2017 [place] [D] Immune Globulin injection What is this medicine? RhO [D] IMMUNE GLOBULIN (i MYOON GLOB yoo lin) is used to treat idiopathic thrombocytopenic purpura (ITP). This medicine is used in RhO negative mothers who are pregnant with a RhO positive child. It is also used after a transfusion of RhO positive blood into a RhO negative person. This medicine may be used for other purposes; ask your health care provider or pharmacist if you have questions. COMMON BRAND NAME(S): BayRho-D, HyperRHO S/D, MICRhoGAM, RhoGAM, Rhophylac, WinRho SDF What should I tell my health care provider before I take this medicine? They need to know if you have any of these conditions: -bleeding disorders -low levels of immunoglobulin A in the body -no spleen -an unusual or allergic reaction to human immune globulin, other medicines, foods, dyes, or preservatives -pregnant or trying to get pregnant -breast-feeding How should I use this medicine? This medicine is for injection into a muscle or into a vein. It is given by a health care professional in a hospital or clinic setting. Talk to your pediatrician regarding the use of this medicine in children. This medicine is not approved for use in children. Overdosage: If you think you have taken too much of this medicine contact a poison control center or emergency room at once. NOTE: This medicine is only for you. Do not share this medicine with others. What if I miss a dose? It is important not to miss your dose. Call your doctor or health care professional if you are unable to keep an appointment. What may interact with this medicine? -live virus vaccines, like measles, mumps, or rubella This list may not describe all possible interactions. Give your health care provider a list of all the medicines, herbs, non-prescription drugs, or dietary supplements you use. Also tell  them if you smoke, drink alcohol, or use illegal drugs. Some items may interact with your medicine. What should I watch for while using this medicine? This medicine is made from human blood. It may be possible to pass an infection in this medicine. Talk to your doctor about the risks and benefits of this medicine. This medicine may interfere with live virus vaccines. Before you get live virus vaccines tell your health care professional if you have received this medicine within the past 3 months. What side effects may I notice from receiving this  medicine? Side effects that you should report to your doctor or health care professional as soon as possible: -allergic reactions like skin rash, itching or hives, swelling of the face, lips, or tongue -breathing problems -chest pain or tightness -yellowing of the eyes or skin Side effects that usually do not require medical attention (report to your doctor or health care professional if they continue or are bothersome): -fever -pain and tenderness at site where injected This list may not describe all possible side effects. Call your doctor for medical advice about side effects. You may report side effects to FDA at 1-800-FDA-1088. Where should I keep my medicine? This drug is given in a hospital or clinic and will not be stored at home. NOTE: This sheet is a summary. It may not cover all possible information. If you have questions about this medicine, talk to your doctor, pharmacist, or health care provider.  2018 Elsevier/Gold Standard (2007-12-01 14:06:10)

## 2016-07-07 LAB — HIV ANTIBODY (ROUTINE TESTING W REFLEX): HIV 1&2 Ab, 4th Generation: NONREACTIVE

## 2016-07-07 LAB — GLUCOSE TOLERANCE, 2 HOURS W/ 1HR
GLUCOSE: 120 mg/dL
Glucose, 2 hour: 92 mg/dL (ref <140)
Glucose, Fasting: 75 mg/dL (ref 65–99)

## 2016-07-07 LAB — RPR

## 2016-07-09 LAB — ANTIBODY SCREEN: Antibody Screen: NEGATIVE

## 2016-07-20 ENCOUNTER — Ambulatory Visit (INDEPENDENT_AMBULATORY_CARE_PROVIDER_SITE_OTHER): Payer: 59 | Admitting: Advanced Practice Midwife

## 2016-07-20 DIAGNOSIS — Z3401 Encounter for supervision of normal first pregnancy, first trimester: Secondary | ICD-10-CM

## 2016-07-20 NOTE — Patient Instructions (Signed)

## 2016-07-20 NOTE — Progress Notes (Signed)
   PRENATAL VISIT NOTE  Subjective:  Kristin [name] is a 36 y.o. G1P0000 at [redacted]w[redacted]d being seen today for ongoing prenatal care.  She is currently monitored for the following issues for this low-risk pregnancy and has Encounter for supervision of normal first pregnancy in first trimester; Obesity in pregnancy; Migraines; and History of loop electrical excision procedure (LEEP) on her problem list.  Patient reports occasional contractions.  Contractions: Not present. Vag. Bleeding: None.  Movement: Present. Denies leaking of fluid.   The following portions of the patient's history were reviewed and updated as appropriate: allergies, current medications, past family history, past medical history, past social history, past surgical history and problem list. Problem list updated.  Objective:   Vitals:   07/20/16 1107  BP: 114/74  Pulse: 84  Weight: 241 lb (109.3 kg)    Fetal Status: Fetal Heart Rate (bpm): 142 Fundal Height: 33 cm Movement: Present     General:  Alert, oriented and cooperative. Patient is in no acute distress.  Skin: Skin is warm and dry. No rash noted.   Cardiovascular: Normal heart rate noted  Respiratory: Normal respiratory effort, no problems with respiration noted  Abdomen: Soft, gravid, appropriate for gestational age. Pain/Pressure: Absent     Pelvic:  Cervical exam deferred        Extremities: Normal range of motion.  Edema: Mild pitting, slight indentation  Mental Status: Normal mood and affect. Normal behavior. Normal judgment and thought content.   Assessment and Plan:  Pregnancy: G1P0000 at [redacted]w[redacted]d  There are no diagnoses linked to this encounter. Preterm labor symptoms and general obstetric precautions including but not limited to vaginal bleeding, contractions, leaking of fluid and fetal movement were reviewed in detail with the patient. Please refer to After Visit Summary for other counseling recommendations.  F/U 2 weeks   Manya Silvas, [place]

## 2016-08-03 ENCOUNTER — Ambulatory Visit (INDEPENDENT_AMBULATORY_CARE_PROVIDER_SITE_OTHER): Payer: 59 | Admitting: Family

## 2016-08-03 ENCOUNTER — Encounter: Payer: Self-pay | Admitting: *Deleted

## 2016-08-03 DIAGNOSIS — Z3403 Encounter for supervision of normal first pregnancy, third trimester: Secondary | ICD-10-CM

## 2016-08-03 DIAGNOSIS — Z3401 Encounter for supervision of normal first pregnancy, first trimester: Secondary | ICD-10-CM

## 2016-08-03 NOTE — Progress Notes (Signed)
   PRENATAL VISIT NOTE  Subjective:  Kristin [name] is a 36 y.o. G1P0000 at [redacted]w[redacted]d being seen today for ongoing prenatal care.  She is currently monitored for the following issues for this low-risk pregnancy and has Encounter for supervision of normal first pregnancy in first trimester; Obesity in pregnancy; Migraines; and History of loop electrical excision procedure (LEEP) on her problem list.  Patient reports no complaints.  Contractions: Irritability. Vag. Bleeding: None.  Movement: Present. Denies leaking of fluid.   The following portions of the patient's history were reviewed and updated as appropriate: allergies, current medications, past family history, past medical history, past social history, past surgical history and problem list. Problem list updated.  Objective:   Vitals:   08/03/16 1056  BP: 106/70  Pulse: 88  Weight: 245 lb (111.1 kg)    Fetal Status: Fetal Heart Rate (bpm): 139 Fundal Height: 35 cm Movement: Present     General:  Alert, oriented and cooperative. Patient is in no acute distress.  Skin: Skin is warm and dry. No rash noted.   Cardiovascular: Normal heart rate noted  Respiratory: Normal respiratory effort, no problems with respiration noted  Abdomen: Soft, gravid, appropriate for gestational age. Pain/Pressure: Absent     Pelvic:  Cervical exam deferred        Extremities: Normal range of motion.  Edema: Moderate pitting, indentation subsides rapidly  Mental Status: Normal mood and affect. Normal behavior. Normal judgment and thought content.   Assessment and Plan:  Pregnancy: G1P0000 at [redacted]w[redacted]d  1. Encounter for supervision of normal first pregnancy in first trimester - [place] MFM OB FOLLOW UP; Future > growth at 36 weeks  Preterm labor symptoms and general obstetric precautions including but not limited to vaginal bleeding, contractions, leaking of fluid and fetal movement were reviewed in detail with the patient. Please refer to After Visit Summary for  other counseling recommendations.  Return in about 2 weeks (around 08/17/2016).   Venia Carbon Michiel Cowboy, CNM

## 2016-08-07 ENCOUNTER — Encounter: Payer: Self-pay | Admitting: *Deleted

## 2016-08-17 ENCOUNTER — Ambulatory Visit (INDEPENDENT_AMBULATORY_CARE_PROVIDER_SITE_OTHER): Payer: 59 | Admitting: Advanced Practice Midwife

## 2016-08-17 DIAGNOSIS — K649 Unspecified hemorrhoids: Secondary | ICD-10-CM

## 2016-08-17 MED ORDER — HYDROCORTISONE ACE-PRAMOXINE 1-1 % RE FOAM: 1.0000 | Freq: Two times a day (BID) | RECTAL | 3 refills | Status: DC

## 2016-08-20 DIAGNOSIS — K649 Unspecified hemorrhoids: Secondary | ICD-10-CM | POA: Insufficient documentation

## 2016-08-20 NOTE — Progress Notes (Signed)
   PRENATAL VISIT NOTE  Subjective:  Kristin [name] is a 36 y.o. G1P0000 at [redacted]w[redacted]d being seen today for ongoing prenatal care.  She is currently monitored for the following issues for this low-risk pregnancy and has Encounter for supervision of normal first pregnancy in first trimester; Obesity in pregnancy; Migraines; and History of loop electrical excision procedure (LEEP) on her problem list.  Patient reports hemorrhoids.  Contractions: Irritability. Vag. Bleeding: None.  Movement: Present. Denies leaking of fluid.   The following portions of the patient's history were reviewed and updated as appropriate: allergies, current medications, past family history, past medical history, past social history, past surgical history and problem list. Problem list updated.  Objective:   Vitals:   08/17/16 1035  BP: 115/78  Pulse: 87  Weight: 252 lb (114.3 kg)    Fetal Status: Fetal Heart Rate (bpm): 142   Movement: Present     General:  Alert, oriented and cooperative. Patient is in no acute distress.  Skin: Skin is warm and dry. No rash noted.   Cardiovascular: Normal heart rate noted  Respiratory: Normal respiratory effort, no problems with respiration noted  Abdomen: Soft, gravid, appropriate for gestational age. Pain/Pressure: Present     Pelvic:  Cervical exam deferred        Extremities: Normal range of motion.  Edema: Moderate pitting, indentation subsides rapidly  Mental Status: Normal mood and affect. Normal behavior. Normal judgment and thought content.   Assessment and Plan:  Pregnancy: G1P0000 at [redacted]w[redacted]d  There are no diagnoses linked to this encounter. Preterm labor symptoms and general obstetric precautions including but not limited to vaginal bleeding, contractions, leaking of fluid and fetal movement were reviewed in detail with the patient. Please refer to After Visit Summary for other counseling recommendations.  RTO 2 wks Rx Proctofoam HC for hemorrhoids   Seabron Spates, CNM

## 2016-08-20 NOTE — Patient Instructions (Signed)
Hemorrhoids Hemorrhoids are swollen veins in and around the rectum or anus. There are two types of hemorrhoids:  Internal hemorrhoids. These occur in the veins that are just inside the rectum. They may poke through to the outside and become irritated and painful.  External hemorrhoids. These occur in the veins that are outside of the anus and can be felt as a painful swelling or hard lump near the anus.  Most hemorrhoids do not cause serious problems, and they can be managed with home treatments such as diet and lifestyle changes. If home treatments do not help your symptoms, procedures can be done to shrink or remove the hemorrhoids. What are the causes? This condition is caused by increased pressure in the anal area. This pressure may result from various things, including:  Constipation.  Straining to have a bowel movement.  Diarrhea.  Pregnancy.  Obesity.  Sitting for long periods of time.  Heavy lifting or other activity that causes you to strain.  Anal sex.  What are the signs or symptoms? Symptoms of this condition include:  Pain.  Anal itching or irritation.  Rectal bleeding.  Leakage of stool (feces).  Anal swelling.  One or more lumps around the anus.  How is this diagnosed? This condition can often be diagnosed through a visual exam. Other exams or tests may also be done, such as:  Examination of the rectal area with a gloved hand (digital rectal exam).  Examination of the anal canal using a small tube (anoscope).  A blood test, if you have lost a significant amount of blood.  A test to look inside the colon (sigmoidoscopy or colonoscopy).  How is this treated? This condition can usually be treated at home. However, various procedures may be done if dietary changes, lifestyle changes, and other home treatments do not help your symptoms. These procedures can help make the hemorrhoids smaller or remove them completely. Some of these procedures involve  surgery, and others do not. Common procedures include:  Rubber band ligation. Rubber bands are placed at the base of the hemorrhoids to cut off the blood supply to them.  Sclerotherapy. Medicine is injected into the hemorrhoids to shrink them.  Infrared coagulation. A type of light energy is used to get rid of the hemorrhoids.  Hemorrhoidectomy surgery. The hemorrhoids are surgically removed, and the veins that supply them are tied off.  Stapled hemorrhoidopexy surgery. A circular stapling device is used to remove the hemorrhoids and use staples to cut off the blood supply to them.  Follow these instructions at home: Eating and drinking  Eat foods that have a lot of fiber in them, such as whole grains, beans, nuts, fruits, and vegetables. Ask your health care provider about taking products that have added fiber (fiber supplements).  Drink enough fluid to keep your urine clear or pale yellow. Managing pain and swelling  Take warm sitz baths for 20 minutes, 3-4 times a day to ease pain and discomfort.  If directed, apply ice to the affected area. Using ice packs between sitz baths may be helpful. ? Put ice in a plastic bag. ? Place a towel between your skin and the bag. ? Leave the ice on for 20 minutes, 2-3 times a day. General instructions  Take over-the-counter and prescription medicines only as told by your health care provider.  Use medicated creams or suppositories as told.  Exercise regularly.  Go to the bathroom when you have the urge to have a bowel movement. Do not wait.    Avoid straining to have bowel movements.  Keep the anal area dry and clean. Use wet toilet paper or moist towelettes after a bowel movement.  Do not sit on the toilet for long periods of time. This increases blood pooling and pain. Contact a health care provider if:  You have increasing pain and swelling that are not controlled by treatment or medicine.  You have uncontrolled bleeding.  You  have difficulty having a bowel movement, or you are unable to have a bowel movement.  You have pain or inflammation outside the area of the hemorrhoids. This information is not intended to replace advice given to you by your health care provider. Make sure you discuss any questions you have with your health care provider. Document Released: 03/30/2000 Document Revised: 08/31/2015 Document Reviewed: 12/15/2014 Elsevier Interactive Patient Education  2017 [employer]vier Inc.  

## 2016-08-29 ENCOUNTER — Ambulatory Visit (HOSPITAL_COMMUNITY)
Admission: RE | Admit: 2016-08-29 | Discharge: 2016-08-29 | Disposition: A | Discharge status: Home / Self Care | Payer: 59 | Source: Ambulatory Visit | Attending: Family | Admitting: Family

## 2016-08-29 ENCOUNTER — Other Ambulatory Visit: Payer: Self-pay | Admitting: Family

## 2016-08-29 DIAGNOSIS — Z3A36 36 weeks gestation of pregnancy: Secondary | ICD-10-CM | POA: Insufficient documentation

## 2016-08-29 DIAGNOSIS — O3663X0 Maternal care for excessive fetal growth, third trimester, not applicable or unspecified: Secondary | ICD-10-CM

## 2016-08-29 DIAGNOSIS — D259 Leiomyoma of uterus, unspecified: Secondary | ICD-10-CM | POA: Diagnosis not present

## 2016-08-29 DIAGNOSIS — O99213 Obesity complicating pregnancy, third trimester: Secondary | ICD-10-CM

## 2016-08-29 DIAGNOSIS — O3413 Maternal care for benign tumor of corpus uteri, third trimester: Secondary | ICD-10-CM | POA: Diagnosis not present

## 2016-08-29 DIAGNOSIS — E669 Obesity, unspecified: Secondary | ICD-10-CM | POA: Diagnosis not present

## 2016-08-29 DIAGNOSIS — Z6835 Body mass index (BMI) 35.0-35.9, adult: Secondary | ICD-10-CM | POA: Diagnosis not present

## 2016-08-29 DIAGNOSIS — O3443 Maternal care for other abnormalities of cervix, third trimester: Secondary | ICD-10-CM | POA: Diagnosis not present

## 2016-08-29 DIAGNOSIS — Z9889 Other specified postprocedural states: Secondary | ICD-10-CM

## 2016-08-29 DIAGNOSIS — Z3401 Encounter for supervision of normal first pregnancy, first trimester: Secondary | ICD-10-CM

## 2016-08-29 NOTE — Addendum Note (Signed)
Encounter addended by: Tama High Mae on: 08/29/2016 11:58 AM<BR>    Actions taken: Imaging Exam ended

## 2016-09-03 ENCOUNTER — Ambulatory Visit (INDEPENDENT_AMBULATORY_CARE_PROVIDER_SITE_OTHER): Payer: 59 | Admitting: Certified Nurse Midwife

## 2016-09-03 VITALS — BP 110/80 | HR 88 | Wt 257.0 lb

## 2016-09-03 DIAGNOSIS — R609 Edema, unspecified: Secondary | ICD-10-CM | POA: Insufficient documentation

## 2016-09-03 DIAGNOSIS — Z3401 Encounter for supervision of normal first pregnancy, first trimester: Secondary | ICD-10-CM

## 2016-09-03 DIAGNOSIS — O3660X0 Maternal care for excessive fetal growth, unspecified trimester, not applicable or unspecified: Secondary | ICD-10-CM | POA: Insufficient documentation

## 2016-09-03 DIAGNOSIS — O3663X0 Maternal care for excessive fetal growth, third trimester, not applicable or unspecified: Secondary | ICD-10-CM

## 2016-09-03 DIAGNOSIS — Z3403 Encounter for supervision of normal first pregnancy, third trimester: Secondary | ICD-10-CM

## 2016-09-03 DIAGNOSIS — O99213 Obesity complicating pregnancy, third trimester: Secondary | ICD-10-CM

## 2016-09-03 DIAGNOSIS — O99210 Obesity complicating pregnancy, unspecified trimester: Secondary | ICD-10-CM

## 2016-09-03 LAB — OB RESULTS CONSOLE GBS: STREP GROUP B AG: NEGATIVE

## 2016-09-03 NOTE — Progress Notes (Signed)
Subjective:  Kristin [name] is a 36 y.o. G1P0000 at [redacted]w[redacted]d being seen today for ongoing prenatal care.  She is currently monitored for the following issues for this low-risk pregnancy and has Encounter for supervision of normal first pregnancy in first trimester; Obesity in pregnancy; Migraines; History of loop electrical excision procedure (LEEP); Hemorrhoids; LGA (large for gestational age) fetus affecting management of mother; and Dependent edema on her problem list.  Patient reports no complaints.  Contractions: Irritability. Vag. Bleeding: None.  Movement: Present. Denies leaking of fluid.   The following portions of the patient's history were reviewed and updated as appropriate: allergies, current medications, past family history, past medical history, past social history, past surgical history and problem list. Problem list updated.  Objective:   Vitals:   09/03/16 0852  BP: 110/80  Pulse: 88  Weight: 257 lb (116.6 kg)    Fetal Status: Fetal Heart Rate (bpm): 145 Fundal Height: 37 cm Movement: Present  Presentation: Vertex  General:  Alert, oriented and cooperative. Patient is in no acute distress.  Skin: Skin is warm and dry. No rash noted.   Cardiovascular: Normal heart rate noted  Respiratory: Normal respiratory effort, no problems with respiration noted  Abdomen: Soft, gravid, appropriate for gestational age. Pain/Pressure: Present     Pelvic: Vag. Bleeding: None Vag D/C Character: Thin   Cervical exam performed Dilation: 1 Effacement (%): 50 Station: -3  Extremities: Normal range of motion.  Edema: Moderate pitting, indentation subsides rapidly  Mental Status: Normal mood and affect. Normal behavior. Normal judgment and thought content.   Urinalysis: Urine Protein: Trace Urine Glucose: Negative  Assessment and Plan:  Pregnancy: G1P0000 at [redacted]w[redacted]d  1. Obesity in pregnancy - weight stable - Culture, beta strep (group b only) - GC/Chlamydia Probe Amp  2. Encounter for  supervision of normal first pregnancy - planning waterbirth  3. Excessive fetal growth affecting management of pregnancy in third trimester, single or unspecified fetus - suspected LGA - EFW 7'5, 87%ile at 36 wks - f/u US at 39 wks for EFW  4. Dependent edema - can't fit compressions - elevate legs - epsom soaks  Term labor symptoms and general obstetric precautions including but not limited to vaginal bleeding, contractions, leaking of fluid and fetal movement were reviewed in detail with the patient. Please refer to After Visit Summary for other counseling recommendations.  Return in about 1 week (around 09/10/2016).   Julianne Handler, CNM

## 2016-09-04 LAB — GC/CHLAMYDIA PROBE AMP
CT PROBE, AMP APTIMA: NOT DETECTED
GC Probe RNA: NOT DETECTED

## 2016-09-05 ENCOUNTER — Inpatient Hospital Stay (HOSPITAL_COMMUNITY)
Admission: AD | Admit: 2016-09-05 | Discharge: 2016-09-05 | Disposition: A | Discharge status: Home / Self Care | Payer: 59 | Source: Ambulatory Visit | Attending: Family Medicine | Admitting: Family Medicine

## 2016-09-05 ENCOUNTER — Encounter (HOSPITAL_COMMUNITY): Payer: Self-pay

## 2016-09-05 DIAGNOSIS — Z79899 Other long term (current) drug therapy: Secondary | ICD-10-CM | POA: Insufficient documentation

## 2016-09-05 DIAGNOSIS — F329 Major depressive disorder, single episode, unspecified: Secondary | ICD-10-CM | POA: Diagnosis not present

## 2016-09-05 DIAGNOSIS — O3663X0 Maternal care for excessive fetal growth, third trimester, not applicable or unspecified: Secondary | ICD-10-CM | POA: Diagnosis not present

## 2016-09-05 DIAGNOSIS — O99343 Other mental disorders complicating pregnancy, third trimester: Secondary | ICD-10-CM | POA: Insufficient documentation

## 2016-09-05 DIAGNOSIS — R600 Localized edema: Secondary | ICD-10-CM | POA: Diagnosis not present

## 2016-09-05 DIAGNOSIS — Z3A37 37 weeks gestation of pregnancy: Secondary | ICD-10-CM | POA: Insufficient documentation

## 2016-09-05 DIAGNOSIS — O479 False labor, unspecified: Secondary | ICD-10-CM

## 2016-09-05 DIAGNOSIS — O99213 Obesity complicating pregnancy, third trimester: Secondary | ICD-10-CM | POA: Insufficient documentation

## 2016-09-05 DIAGNOSIS — E669 Obesity, unspecified: Secondary | ICD-10-CM | POA: Insufficient documentation

## 2016-09-05 DIAGNOSIS — O99210 Obesity complicating pregnancy, unspecified trimester: Secondary | ICD-10-CM

## 2016-09-05 DIAGNOSIS — O368130 Decreased fetal movements, third trimester, not applicable or unspecified: Secondary | ICD-10-CM | POA: Insufficient documentation

## 2016-09-05 DIAGNOSIS — O471 False labor at or after 37 completed weeks of gestation: Secondary | ICD-10-CM | POA: Diagnosis not present

## 2016-09-05 DIAGNOSIS — Z87891 Personal history of nicotine dependence: Secondary | ICD-10-CM | POA: Diagnosis not present

## 2016-09-05 DIAGNOSIS — R109 Unspecified abdominal pain: Secondary | ICD-10-CM | POA: Diagnosis present

## 2016-09-05 DIAGNOSIS — Z885 Allergy status to narcotic agent status: Secondary | ICD-10-CM | POA: Diagnosis not present

## 2016-09-05 DIAGNOSIS — R609 Edema, unspecified: Secondary | ICD-10-CM

## 2016-09-05 LAB — URINALYSIS, ROUTINE W REFLEX MICROSCOPIC
Bilirubin Urine: NEGATIVE
GLUCOSE, UA: NEGATIVE mg/dL
HGB URINE DIPSTICK: NEGATIVE
KETONES UR: NEGATIVE mg/dL
NITRITE: NEGATIVE
PH: 6 (ref 5.0–8.0)
Protein, ur: NEGATIVE mg/dL
RBC / HPF: NONE SEEN RBC/hpf (ref 0–5)
Specific Gravity, Urine: 1.027 (ref 1.005–1.030)

## 2016-09-05 NOTE — Discharge Instructions (Signed)

## 2016-09-05 NOTE — MAU Note (Signed)
Upper abd feels very tight and painful and not relaxing.  Upper back is hurting and making it hard to breath.  No leaking. No bleeding. Can't feel baby move cause belly won't relax.

## 2016-09-05 NOTE — MAU Provider Note (Signed)
History    Patient Kristin [name] is a 36 y.o. G1P0000 at [redacted]w[redacted]d here with complaints of abdominal tightening and feeling like the baby wasn't moving when her abdomen was tightening up. Patient endorses good fetal movements otherwise, no bleeding or leaking of fluid.   She had a prenatl visit on 09-03-2016.  CSN: [id number]  Arrival date and time: 09/05/16 7124   First Provider Initiated Contact with Patient 09/05/16 [phone]      Chief Complaint  Patient presents with  . Abdominal Pain  . Decreased Fetal Movement  . Contractions   HPI  OB History    Gravida Para Term Preterm AB Living   1 0 0 0 0 0   SAB TAB Ectopic Multiple Live Births   0 0 0 0        Past Medical History:  Diagnosis Date  . Abnormal Pap smear of cervix 2008  . Arthritis   . Depression    migrane  . History of dysmenorrhea   . Migraine   . Ulcer    stomach  . Vein, varicose     Past Surgical History:  Procedure Laterality Date  . LEEP  2008  . WISDOM TOOTH EXTRACTION     x3    Family History  Problem Relation Age of Onset  . Cancer Mother        breast  . Cancer Maternal Grandmother        breast  . Depression Maternal Grandfather   . Depression Paternal Grandmother   . Down syndrome Paternal Uncle     Social History  Substance Use Topics  . Smoking status: Former Smoker    Packs/day: 0.50    Types: Cigarettes    Quit date: 12/16/2015  . Smokeless tobacco: Never Used  . Alcohol use No     Comment: " a few drinks a night"    Allergies:  Allergies  Allergen Reactions  . Vicodin [Hydrocodone-Acetaminophen] Nausea And Vomiting    Prescriptions Prior to Admission  Medication Sig Dispense Refill Last Dose  . hydrocortisone-pramoxine (PROCTOFOAM HC) rectal foam Place 1 applicator rectally 2 (two) times daily. 10 g 3 Taking  . hydrOXYzine (VISTARIL) 25 MG capsule Take 1 capsule (25 mg total) by mouth 3 (three) times daily as needed for anxiety. 30 capsule 1 Taking  . omeprazole  (PRILOSEC) 40 MG capsule    Taking  . polyethylene glycol powder (GLYCOLAX/MIRALAX) powder Take 17 g by mouth 2 (two) times daily. 255 g 0 Taking  . Prenatal Vit-Fe Fumarate-FA (PREPLUS) 27-1 MG TABS    Taking  . SUMAtriptan (IMITREX) 100 MG tablet Take 1 tablet (100 mg total) by mouth once as needed for migraine. May repeat in 2 hours if headache persists or recurs. 9 tablet 11 Taking    Review of Systems  Respiratory: Negative.   Cardiovascular: Positive for leg swelling.       Bilateral pitting edema on each calf; patient says this is normal for her  Gastrointestinal: Positive for abdominal pain.  Genitourinary: Negative.   Musculoskeletal: Negative.   Neurological: Negative.    Physical Exam   Blood pressure (!) 122/59, pulse (!) 105, temperature 97.8 F (36.6 C), temperature source Oral, resp. rate 20, height 5\' 7"  (1.702 m), weight 257 lb (116.6 kg), last menstrual period 12/17/2015, SpO2 99 %.  Physical Exam  Constitutional: She is oriented to person, place, and time. She appears well-developed and well-nourished.  HENT:  Head: Normocephalic.  Neck: Normal range of motion.  Respiratory: Effort normal.  GI: Soft. Bowel sounds are normal. She exhibits no distension and no mass. There is no tenderness. There is no rebound and no guarding.  Abdomen is soft, non-tender.   Genitourinary: Vagina normal. No vaginal discharge found.  Genitourinary Comments: NEFG; cervix is FT, posterior, ballotable.   Musculoskeletal: Normal range of motion.  Neurological: She is alert and oriented to person, place, and time.  Skin: Skin is warm and dry.    MAU Course  Procedures  MDM -NST: 145 with moderate variabiliyt, no decels, present acels, occasional contractions and leakign of fluid -Patient states that her pain is resolved, although she feels some contractions now in her lower belly. She no longer feels the tightening in her upper abdomen.  -UA; normal, slight dehydration Assessment  and Plan   1. Braxton Hick's contraction   2. Excessive fetal growth affecting management of pregnancy in third trimester, single or unspecified fetus   3. Dependent edema   4. Obesity in pregnancy    2. Patient stable for discharge with recommendation to drink plenty of water and try tylenol and heat for abdominal pain.  3. Reviewed when to return to MAU (bleeding, leaking of fluid, decreased fetal movements, contractions that are not relieved by rest or comfort measures) 4.Patient plans to keep her prenatal visit  on 09-10-2016.  Mervyn Skeeters Jonae Renshaw 09/05/2016, 6:39 AM

## 2016-09-06 LAB — CULTURE, BETA STREP (GROUP B ONLY)

## 2016-09-13 ENCOUNTER — Encounter: Payer: Self-pay | Admitting: Obstetrics & Gynecology

## 2016-09-13 ENCOUNTER — Ambulatory Visit (INDEPENDENT_AMBULATORY_CARE_PROVIDER_SITE_OTHER): Payer: 59 | Admitting: Obstetrics & Gynecology

## 2016-09-13 ENCOUNTER — Encounter: Payer: Self-pay | Admitting: *Deleted

## 2016-09-13 VITALS — BP 117/73 | HR 83 | Wt 259.0 lb

## 2016-09-13 DIAGNOSIS — O3663X0 Maternal care for excessive fetal growth, third trimester, not applicable or unspecified: Secondary | ICD-10-CM

## 2016-09-13 DIAGNOSIS — Z3401 Encounter for supervision of normal first pregnancy, first trimester: Secondary | ICD-10-CM

## 2016-09-13 DIAGNOSIS — R609 Edema, unspecified: Secondary | ICD-10-CM

## 2016-09-13 DIAGNOSIS — Z3403 Encounter for supervision of normal first pregnancy, third trimester: Secondary | ICD-10-CM

## 2016-09-13 LAB — GLUCOSE, POCT (MANUAL RESULT ENTRY): POC Glucose: 76 mg/dl (ref 70–99)

## 2016-09-13 NOTE — Addendum Note (Signed)
Addended by: Asencion Islam on: 09/13/2016 04:29 PM   Modules accepted: Orders

## 2016-09-13 NOTE — Progress Notes (Signed)
   PRENATAL VISIT NOTE  Subjective:  Kristin [name] is a 36 y.o. G1P0000 at [redacted]w[redacted]d being seen today for ongoing prenatal care.  She is currently monitored for the following issues for this low-risk pregnancy and has Encounter for supervision of normal first pregnancy in first trimester; Obesity in pregnancy; Migraines; History of loop electrical excision procedure (LEEP); Hemorrhoids; LGA (large for gestational age) fetus affecting management of mother; and Dependent edema on her problem list.  Patient reports no complaints.  Contractions: Irregular. Vag. Bleeding: None.  Movement: Present. Denies leaking of fluid.   The following portions of the patient's history were reviewed and updated as appropriate: allergies, current medications, past family history, past medical history, past social history, past surgical history and problem list. Problem list updated.  Objective:   Vitals:   09/13/16 1338  BP: 117/73  Pulse: 83  Weight: 259 lb (117.5 kg)    Fetal Status:     Movement: Present     General:  Alert, oriented and cooperative. Patient is in no acute distress.  Skin: Skin is warm and dry. No rash noted.   Cardiovascular: Normal heart rate noted  Respiratory: Normal respiratory effort, no problems with respiration noted  Abdomen: Soft, gravid, appropriate for gestational age. Pain/Pressure: Present     Pelvic:  Cervical exam performed      2/70/-3 VTX  2/Extremities: Normal range of motion.  Edema: Deep pitting, indentation remains for a short time  Mental Status: Normal mood and affect. Normal behavior. Normal judgment and thought content.   Assessment and Plan:  Pregnancy: G1P0000 at [redacted]w[redacted]d  1. Encounter for supervision of normal first pregnancy in first trimester GBS negative  2. Dependent edema calves both measure 54 cm.  No redness or pain.  No evidnece of DVT.  Pt having difficulty walking due to edema, especially when involving the knees.  Given her discomfort will induce  at 40 weeks.    3. Excessive fetal growth affecting management of pregnancy in third trimester, single or unspecified fetus 87% of 08/29/16.  CBG 1 hour pp today is 7.  Fundal height is 37.  Pt wants try of NSVD and unlikely that patient is 5000 g in less than 3 weeks.   Term labor symptoms and general obstetric precautions including but not limited to vaginal bleeding, contractions, leaking of fluid and fetal movement were reviewed in detail with the patient. Please refer to After Visit Summary for other counseling recommendations.   RTC 1 week  Silas Sacramento, MD

## 2016-09-14 ENCOUNTER — Telehealth (HOSPITAL_COMMUNITY): Payer: Self-pay | Admitting: *Deleted

## 2016-09-14 NOTE — Telephone Encounter (Signed)
Preadmission screen  

## 2016-09-18 ENCOUNTER — Ambulatory Visit (INDEPENDENT_AMBULATORY_CARE_PROVIDER_SITE_OTHER): Payer: 59 | Admitting: Obstetrics & Gynecology

## 2016-09-18 VITALS — BP 132/80 | HR 88 | Wt 265.0 lb

## 2016-09-18 DIAGNOSIS — Z3401 Encounter for supervision of normal first pregnancy, first trimester: Secondary | ICD-10-CM

## 2016-09-18 NOTE — Progress Notes (Signed)
   PRENATAL VISIT NOTE  Subjective:  Kristin [name] is a 36 y.o. G1P0000 at [redacted]w[redacted]d being seen today for ongoing prenatal care.  She is currently monitored for the following issues for this low-risk pregnancy and has Encounter for supervision of normal first pregnancy in first trimester; Obesity in pregnancy; Migraines; History of loop electrical excision procedure (LEEP); Hemorrhoids; LGA (large for gestational age) fetus affecting management of mother; and Dependent edema on her problem list.  Patient reports no complaints.  Contractions: Irregular. Vag. Bleeding: None.  Movement: Present. Denies leaking of fluid.   The following portions of the patient's history were reviewed and updated as appropriate: allergies, current medications, past family history, past medical history, past social history, past surgical history and problem list. Problem list updated.  Objective:   Vitals:   09/18/16 1324  BP: 132/80  Pulse: 88  Weight: 265 lb (120.2 kg)    Fetal Status: Fetal Heart Rate (bpm): 131   Movement: Present     General:  Alert, oriented and cooperative. Patient is in no acute distress.  Skin: Skin is warm and dry. No rash noted.   Cardiovascular: Normal heart rate noted  Respiratory: Normal respiratory effort, no problems with respiration noted  Abdomen: Soft, gravid, appropriate for gestational age. Pain/Pressure: Present     Pelvic:  Cervical exam performed        Extremities: Normal range of motion.  Edema: Deep pitting, indentation remains for a short time  Mental Status: Normal mood and affect. Normal behavior. Normal judgment and thought content.   Assessment and Plan:  Pregnancy: G1P0000 at [redacted]w[redacted]d  1. Encounter for supervision of normal first pregnancy in first trimester - Scheduled for IOL due to extreme edema  Term labor symptoms and general obstetric precautions including but not limited to vaginal bleeding, contractions, leaking of fluid and fetal movement were  reviewed in detail with the patient. Please refer to After Visit Summary for other counseling recommendations.  No Follow-up on file.   [name], MD

## 2016-09-18 NOTE — Progress Notes (Signed)
Lost plug over the weekend and having increase lower back pain and irregular contractions

## 2016-09-19 ENCOUNTER — Encounter (INDEPENDENT_AMBULATORY_CARE_PROVIDER_SITE_OTHER): Payer: Self-pay | Admitting: *Deleted

## 2016-09-19 DIAGNOSIS — Z029 Encounter for administrative examinations, unspecified: Secondary | ICD-10-CM

## 2016-09-19 NOTE — Progress Notes (Signed)
FMLA forms filled out and faxed to [employer] @ [phone]

## 2016-09-21 ENCOUNTER — Inpatient Hospital Stay (HOSPITAL_COMMUNITY)
Admission: AD | Admit: 2016-09-21 | Discharge: 2016-09-23 | DRG: 775 | Disposition: A | Discharge status: Home / Self Care | Payer: 59 | Source: Ambulatory Visit | Attending: Obstetrics and Gynecology | Admitting: Obstetrics and Gynecology

## 2016-09-21 ENCOUNTER — Inpatient Hospital Stay (HOSPITAL_COMMUNITY): Payer: 59 | Admitting: Anesthesiology

## 2016-09-21 ENCOUNTER — Encounter: Payer: 59 | Admitting: Advanced Practice Midwife

## 2016-09-21 ENCOUNTER — Telehealth: Payer: Self-pay

## 2016-09-21 ENCOUNTER — Encounter (HOSPITAL_COMMUNITY): Payer: Self-pay | Admitting: *Deleted

## 2016-09-21 DIAGNOSIS — R609 Edema, unspecified: Secondary | ICD-10-CM

## 2016-09-21 DIAGNOSIS — O4202 Full-term premature rupture of membranes, onset of labor within 24 hours of rupture: Secondary | ICD-10-CM | POA: Diagnosis present

## 2016-09-21 DIAGNOSIS — O99210 Obesity complicating pregnancy, unspecified trimester: Secondary | ICD-10-CM

## 2016-09-21 DIAGNOSIS — O3663X0 Maternal care for excessive fetal growth, third trimester, not applicable or unspecified: Secondary | ICD-10-CM | POA: Diagnosis present

## 2016-09-21 DIAGNOSIS — Z87891 Personal history of nicotine dependence: Secondary | ICD-10-CM | POA: Diagnosis not present

## 2016-09-21 DIAGNOSIS — Z3A39 39 weeks gestation of pregnancy: Secondary | ICD-10-CM | POA: Diagnosis not present

## 2016-09-21 DIAGNOSIS — O4290 Premature rupture of membranes, unspecified as to length of time between rupture and onset of labor, unspecified weeks of gestation: Secondary | ICD-10-CM | POA: Diagnosis present

## 2016-09-21 LAB — CBC
HCT: 34.9 % — ABNORMAL LOW (ref 36.0–46.0)
Hemoglobin: 11.8 g/dL — ABNORMAL LOW (ref 12.0–15.0)
MCH: 29.2 pg (ref 26.0–34.0)
MCHC: 33.8 g/dL (ref 30.0–36.0)
MCV: 86.4 fL (ref 78.0–100.0)
PLATELETS: 292 10*3/uL (ref 150–400)
RBC: 4.04 MIL/uL (ref 3.87–5.11)
RDW: 14.5 % (ref 11.5–15.5)
WBC: 15.8 10*3/uL — ABNORMAL HIGH (ref 4.0–10.5)

## 2016-09-21 LAB — POCT FERN TEST: POCT FERN TEST: POSITIVE

## 2016-09-21 LAB — ABO/RH: ABO/RH(D): A NEG

## 2016-09-21 LAB — TYPE AND SCREEN
ABO/RH(D): A NEG
Antibody Screen: NEGATIVE

## 2016-09-21 MED ORDER — LACTATED RINGERS IV SOLN
500.0000 mL | Freq: Once | INTRAVENOUS | Status: AC
  Administered 2016-09-21: 500 mL via INTRAVENOUS

## 2016-09-21 MED ORDER — LIDOCAINE HCL (PF) 1 % IJ SOLN
INTRAMUSCULAR | Status: DC | PRN
  Administered 2016-09-21 (×2): 4 mL

## 2016-09-21 MED ORDER — SOD CITRATE-CITRIC ACID 500-334 MG/5ML PO SOLN: 30.0000 mL | ORAL | Status: DC | PRN

## 2016-09-21 MED ORDER — FENTANYL 2.5 MCG/ML BUPIVACAINE 1/10 % EPIDURAL INFUSION (WH - ANES): 14.0000 mL/h | INTRAMUSCULAR | Status: DC | PRN

## 2016-09-21 MED ORDER — OXYTOCIN 40 UNITS IN LACTATED RINGERS INFUSION - SIMPLE MED
2.5000 [IU]/h | INTRAVENOUS | Status: DC
  Administered 2016-09-22: 2.5 [IU]/h via INTRAVENOUS
  Filled 2016-09-21: qty 1000

## 2016-09-21 MED ORDER — PANTOPRAZOLE SODIUM 40 MG PO TBEC
40.0000 mg | DELAYED_RELEASE_TABLET | Freq: Every day | ORAL | Status: DC
  Administered 2016-09-21: 40 mg via ORAL
  Filled 2016-09-21: qty 1

## 2016-09-21 MED ORDER — EPHEDRINE 5 MG/ML INJ
10.0000 mg | INTRAVENOUS | Status: DC | PRN
  Filled 2016-09-21: qty 2

## 2016-09-21 MED ORDER — OXYTOCIN 40 UNITS IN LACTATED RINGERS INFUSION - SIMPLE MED
1.0000 m[IU]/min | INTRAVENOUS | Status: DC
  Administered 2016-09-21: 2 m[IU]/min via INTRAVENOUS

## 2016-09-21 MED ORDER — LACTATED RINGERS IV SOLN: 500.0000 mL | INTRAVENOUS | Status: DC | PRN

## 2016-09-21 MED ORDER — PHENYLEPHRINE 40 MCG/ML (10ML) SYRINGE FOR IV PUSH (FOR BLOOD PRESSURE SUPPORT)
80.0000 ug | PREFILLED_SYRINGE | INTRAVENOUS | Status: DC | PRN
  Filled 2016-09-21: qty 5

## 2016-09-21 MED ORDER — FENTANYL 2.5 MCG/ML BUPIVACAINE 1/10 % EPIDURAL INFUSION (WH - ANES)
14.0000 mL/h | INTRAMUSCULAR | Status: DC | PRN
  Administered 2016-09-21 (×2): 14 mL/h via EPIDURAL
  Filled 2016-09-21 (×3): qty 100

## 2016-09-21 MED ORDER — OXYTOCIN BOLUS FROM INFUSION
500.0000 mL | Freq: Once | INTRAVENOUS | Status: AC
  Administered 2016-09-21: 500 mL via INTRAVENOUS

## 2016-09-21 MED ORDER — DIPHENHYDRAMINE HCL 50 MG/ML IJ SOLN: 12.5000 mg | INTRAMUSCULAR | Status: DC | PRN

## 2016-09-21 MED ORDER — TERBUTALINE SULFATE 1 MG/ML IJ SOLN
0.2500 mg | Freq: Once | INTRAMUSCULAR | Status: DC | PRN
  Filled 2016-09-21: qty 1

## 2016-09-21 MED ORDER — LIDOCAINE HCL (PF) 1 % IJ SOLN
30.0000 mL | INTRAMUSCULAR | Status: DC | PRN
  Filled 2016-09-21: qty 30

## 2016-09-21 MED ORDER — LACTATED RINGERS IV SOLN
INTRAVENOUS | Status: DC
  Administered 2016-09-21 (×2): via INTRAVENOUS

## 2016-09-21 MED ORDER — PHENYLEPHRINE 40 MCG/ML (10ML) SYRINGE FOR IV PUSH (FOR BLOOD PRESSURE SUPPORT)
80.0000 ug | PREFILLED_SYRINGE | INTRAVENOUS | Status: DC | PRN
  Filled 2016-09-21: qty 5
  Filled 2016-09-21: qty 10

## 2016-09-21 MED ORDER — FENTANYL CITRATE (PF) 100 MCG/2ML IJ SOLN
100.0000 ug | INTRAMUSCULAR | Status: DC | PRN
  Administered 2016-09-21: 100 ug via INTRAVENOUS
  Filled 2016-09-21: qty 2

## 2016-09-21 MED ORDER — ONDANSETRON HCL 4 MG/2ML IJ SOLN: 4.0000 mg | Freq: Four times a day (QID) | INTRAMUSCULAR | Status: DC | PRN

## 2016-09-21 MED ORDER — ACETAMINOPHEN 325 MG PO TABS
650.0000 mg | ORAL_TABLET | Freq: Four times a day (QID) | ORAL | Status: DC | PRN
  Administered 2016-09-21: 650 mg via ORAL
  Filled 2016-09-21: qty 2

## 2016-09-21 NOTE — H&P (Signed)
Kristin [name] is a 36 y.o. female presenting for ruptured membranes at 10am.  Having some painful contractions now.  Plans waterbirth.  Pregnancy has been followed at [facility] and remarkable for:  Patient Active Problem List   Diagnosis Date Noted  . PROM (premature rupture of membranes) 09/21/2016  . LGA (large for gestational age) fetus affecting management of mother 09/03/2016  . Dependent edema 09/03/2016  . Hemorrhoids 08/20/2016  . History of loop electrical excision procedure (LEEP) 05/15/2016  . Migraines 04/17/2016  . Encounter for supervision of normal first pregnancy in first trimester 02/20/2016  . Obesity in pregnancy 02/20/2016   . OB History    Gravida Para Term Preterm AB Living   1 0 0 0 0 0   SAB TAB Ectopic Multiple Live Births   0 0 0 0       Past Medical History:  Diagnosis Date  . Abnormal Pap smear of cervix 2008  . Arthritis   . Depression    migrane  . History of dysmenorrhea   . Migraine   . Ulcer    stomach  . Vein, varicose    Past Surgical History:  Procedure Laterality Date  . LEEP  2008  . WISDOM TOOTH EXTRACTION     x3   Family History: family history includes Cancer in her maternal grandmother and mother; Depression in her maternal grandfather and paternal grandmother; Down syndrome in her paternal uncle; Hyperlipidemia in her father, paternal grandfather, and paternal grandmother. Social History:  reports that she quit smoking about 9 months ago. Her smoking use included Cigarettes. She smoked 0.50 packs per day. She has never used smokeless tobacco. She reports that she does not drink alcohol or use drugs.     Maternal Diabetes: No Genetic Screening: Normal Maternal Ultrasounds/Referrals: Normal Fetal Ultrasounds or other Referrals:  None Maternal Substance Abuse:  No Significant Maternal Medications:  None Significant Maternal Lab Results:  Lab values include: Group B Strep negative Other Comments:   None  ROS History Dilation: 10 Effacement (%): 90 Station: +2 Exam by:: Lenox Ponds, RN Blood pressure 123/84, pulse (!) 101, temperature 98.3 F (36.8 C), temperature source Oral, resp. rate 18, last menstrual period 12/17/2015, SpO2 100 %. Exam Physical Exam  Prenatal labs: ABO, Rh: --/--/A NEG, A NEG (06/08 1341) Antibody: NEG (06/08 1341) Rubella: 10.30 (11/06 1526) RPR: NON REAC (03/23 1631)  HBsAg: NEGATIVE (11/06 1526)  HIV: NONREACTIVE (03/23 1631)  GBS: Negative (05/21 0000)   Assessment/Plan: Single IUP at [redacted]w[redacted]d PROM at term Early labor  Admit to [facility] Routine orders [place] providers coming     Hansel Feinstein 09/21/2016, 6:26 PM

## 2016-09-21 NOTE — Anesthesia Procedure Notes (Signed)
Epidural Patient location during procedure: OB  Staffing Anesthesiologist: Nolon Nations Performed: anesthesiologist   Preanesthetic Checklist Completed: patient identified, pre-op evaluation, timeout performed, IV checked, risks and benefits discussed and monitors and equipment checked  Epidural Patient position: sitting Prep: site prepped and draped and DuraPrep Patient monitoring: heart rate, continuous pulse ox and blood pressure Approach: midline Location: L3-L4 Injection technique: LOR air and LOR saline  Needle:  Needle type: Tuohy  Needle gauge: 17 G Needle length: 9 cm Needle insertion depth: 7 cm Catheter type: closed end flexible Catheter size: 19 Gauge Catheter at skin depth: 12 cm Test dose: negative  Assessment Sensory level: T8 Events: blood not aspirated, injection not painful, no injection resistance, negative IV test and no paresthesia  Additional Notes Reason for block:procedure for pain

## 2016-09-21 NOTE — Progress Notes (Signed)
RN discussed and clarified with CNM that no IV was needed at this time due to low intervention/ waterbirth.

## 2016-09-21 NOTE — MAU Note (Signed)
Patient presents with rupture of membranes about 1 1/2 hours ago and contractions which started 30 minutes ago.

## 2016-09-21 NOTE — Progress Notes (Signed)
Patient ID: Kristin [name], female   DOB: [birth year], 36 y.o.   MRN: [id number] Now pushing and tolerating well Vitals:   09/21/16 1801 09/21/16 1831 09/21/16 1857 09/21/16 1901  BP: 123/84 127/73 109/81 114/77  Pulse: (!) 101 (!) 119 96 92  Resp: 18 18 16 18   Temp:      TempSrc:      SpO2:       Had to start Pitocin due to hypotonic uterine contractions  FHR reactive, Category I UCs still spaced out  Dilation: 10 Dilation Complete Date: 09/21/16 Dilation Complete Time: 1625 Effacement (%): 90 Cervical Position: Middle Station: +2 Presentation: Vertex Exam by:: Lenox Ponds, RN  Will continue pushing and advancing Pitocin (now at 21mu/min)

## 2016-09-21 NOTE — Progress Notes (Signed)
Labor Progress Note  Kristin [name] is a 36 y.o. G1P0000 at [redacted]w[redacted]d  admitted for PPROM/early labor  O:  BP 120/79   Pulse 99   Temp 99.1 F (37.3 C) (Axillary)   Resp 18   LMP 12/17/2015 (Exact Date) Comment: Neg U Preg in ED   SpO2 100%   No intake/output data recorded.  FHT:  120 bmp, mod var, accels no decels UC:  q28m  SVE:   Dilation: 10 Effacement (%): 90 Station: +2 Exam by:: Lenox Ponds, RN  Pitcoin @ 8 mu/min  Labs: Lab Results  Component Value Date   WBC 15.8 (H) 09/21/2016   HGB 11.8 (L) 09/21/2016   HCT 34.9 (L) 09/21/2016   MCV 86.4 09/21/2016   PLT 292 09/21/2016    Assessment / Plan: 36 y.o. G1P0000 [redacted]w[redacted]d active labor  Labor: Progressing on Pitocin Fetal Wellbeing:  Category I Pain Control:  Epidural Anticipated MOD:  NSVD  Expectant management  Everrett Coombe, MD PGY-1 [facility]

## 2016-09-21 NOTE — Telephone Encounter (Signed)
Pt called stating that she had a large amount of water in her underwear this morning and she has been leaking a little bit more periodically. After speaking to Sherryle Lis, RN we told pt to go to [facility] to be evaluated.

## 2016-09-21 NOTE — Anesthesia Preprocedure Evaluation (Signed)
Anesthesia Evaluation  Patient identified by MRN, date of birth, ID band Patient awake    Reviewed: Allergy & Precautions, NPO status , Patient's Chart, lab work & pertinent test results  Airway Mallampati: II  TM Distance: >3 FB Neck ROM: Full    Dental no notable dental hx.    Pulmonary former smoker,    Pulmonary exam normal breath sounds clear to auscultation       Cardiovascular negative cardio ROS Normal cardiovascular exam Rhythm:Regular Rate:Normal     Neuro/Psych  Headaches, PSYCHIATRIC DISORDERS Depression    GI/Hepatic negative GI ROS, Neg liver ROS,   Endo/Other  negative endocrine ROS  Renal/GU negative Renal ROS     Musculoskeletal  (+) Arthritis ,   Abdominal   Peds  Hematology negative hematology ROS (+)   Anesthesia Other Findings   Reproductive/Obstetrics (+) Pregnancy                             Anesthesia Physical Anesthesia Plan  ASA: III  Anesthesia Plan: Epidural   Post-op Pain Management:    Induction:   PONV Risk Score and Plan:   Airway Management Planned:   Additional Equipment:   Intra-op Plan:   Post-operative Plan:   Informed Consent: I have reviewed the patients History and Physical, chart, labs and discussed the procedure including the risks, benefits and alternatives for the proposed anesthesia with the patient or authorized representative who has indicated his/her understanding and acceptance.     Plan Discussed with:   Anesthesia Plan Comments:         Anesthesia Quick Evaluation

## 2016-09-21 NOTE — Progress Notes (Signed)
Patient seen. With pushing Infant at +3 station. She would like some rest. She has dysfunctional contraction pattern at this time.  Pitocin has been started. Can labor down for 30-45 minutes and continue to increase pitocin while laboring down.   FHTS: 145 with early decelerations  A/P expect SVD.  Jacquiline Doe, MD 09/21/16 9:30 PM

## 2016-09-21 NOTE — Progress Notes (Signed)
Patient ID: Kristin [name], female   DOB: 12-02-1980, 36 y.o.   MRN: [id number] Not coping well with contractions Feels a lot of rectal pressure  Most pain is in back, unrelieved by position  Vitals:   09/21/16 1132  BP: 137/79  Pulse: (!) 111  Resp: 18  Temp: 99.1 F (37.3 C)   FHR stable  Dilation: 3 Effacement (%): 90 Cervical Position: Middle Station: 0 Presentation: Vertex Exam by:: Lenox Ponds, RN  Tub here and being set up Patient is considering Nitrous  May consider sterile water papules later

## 2016-09-21 NOTE — Progress Notes (Signed)
Patient ID: Kristin [name], female   DOB: Jul 04, 1980, 36 y.o.   MRN: [id number] Has decided to get epidural Water helped for a while, but now not helping Wants Fentanyl while waiting  FHR stable UCs every 2 min  Cervix 4-5cm per RN  Good progress  Will observe

## 2016-09-21 NOTE — Anesthesia Pain Management Evaluation Note (Signed)
  CRNA Pain Management Visit Note  Patient: Kristin [name], 36 y.o., female  "Hello I am a member of the anesthesia team at [facility]. We have an anesthesia team available at all times to provide care throughout the hospital, including epidural management and anesthesia for C-section. I don't know your plan for the delivery whether it a natural birth, water birth, IV sedation, nitrous supplementation, doula or epidural, but we want to meet your pain goals."   1.Was your pain managed to your expectations on prior hospitalizations?   No prior hospitalizations  2.What is your expectation for pain management during this hospitalization?     Water tub  3.How can we help you reach that goal?   Record the patient's initial score and the patient's pain goal.   Pain: 5  Pain Goal: 10 [facility] wants you to be able to say your pain was always managed very well.  Jabier Mutton 09/21/2016

## 2016-09-22 ENCOUNTER — Inpatient Hospital Stay (HOSPITAL_COMMUNITY): Admission: RE | Admit: 2016-09-22 | Payer: 59 | Source: Ambulatory Visit | Admitting: Obstetrics & Gynecology

## 2016-09-22 ENCOUNTER — Encounter (HOSPITAL_COMMUNITY): Payer: Self-pay

## 2016-09-22 DIAGNOSIS — Z3A39 39 weeks gestation of pregnancy: Secondary | ICD-10-CM

## 2016-09-22 DIAGNOSIS — O4202 Full-term premature rupture of membranes, onset of labor within 24 hours of rupture: Secondary | ICD-10-CM

## 2016-09-22 LAB — CORD BLOOD GAS (ARTERIAL)
Bicarbonate: 17.7 mmol/L — ABNORMAL LOW (ref 20.0–28.0)
PCO2 CORD BLOOD: 55.5 mmHg (ref 42.0–56.0)
pH cord blood (arterial): 7.13 — CL (ref 7.210–7.380)

## 2016-09-22 LAB — RPR: RPR Ser Ql: NONREACTIVE

## 2016-09-22 MED ORDER — DIPHENHYDRAMINE HCL 25 MG PO CAPS: 25.0000 mg | ORAL_CAPSULE | Freq: Four times a day (QID) | ORAL | Status: DC | PRN

## 2016-09-22 MED ORDER — ONDANSETRON HCL 4 MG/2ML IJ SOLN: 4.0000 mg | INTRAMUSCULAR | Status: DC | PRN

## 2016-09-22 MED ORDER — COCONUT OIL OIL
1.0000 "application " | TOPICAL_OIL | Status: DC | PRN
  Administered 2016-09-23: 1 via TOPICAL
  Filled 2016-09-22: qty 120

## 2016-09-22 MED ORDER — ZOLPIDEM TARTRATE 5 MG PO TABS: 5.0000 mg | ORAL_TABLET | Freq: Every evening | ORAL | Status: DC | PRN

## 2016-09-22 MED ORDER — KETOROLAC TROMETHAMINE 60 MG/2ML IM SOLN
60.0000 mg | Freq: Once | INTRAMUSCULAR | Status: AC
  Administered 2016-09-22: 60 mg via INTRAMUSCULAR
  Filled 2016-09-22: qty 2

## 2016-09-22 MED ORDER — TETANUS-DIPHTH-ACELL PERTUSSIS 5-2.5-18.5 LF-MCG/0.5 IM SUSP: 0.5000 mL | Freq: Once | INTRAMUSCULAR | Status: DC

## 2016-09-22 MED ORDER — PRENATAL MULTIVITAMIN CH
1.0000 | ORAL_TABLET | Freq: Every day | ORAL | Status: DC
  Administered 2016-09-22: 1 via ORAL
  Filled 2016-09-22: qty 1

## 2016-09-22 MED ORDER — WITCH HAZEL-GLYCERIN EX PADS: 1.0000 "application " | MEDICATED_PAD | CUTANEOUS | Status: DC | PRN

## 2016-09-22 MED ORDER — BENZOCAINE-MENTHOL 20-0.5 % EX AERO
1.0000 "application " | INHALATION_SPRAY | CUTANEOUS | Status: DC | PRN
  Filled 2016-09-22 (×2): qty 56

## 2016-09-22 MED ORDER — DIBUCAINE 1 % RE OINT
1.0000 "application " | TOPICAL_OINTMENT | RECTAL | Status: DC | PRN
  Administered 2016-09-22: 1 via RECTAL
  Filled 2016-09-22: qty 28

## 2016-09-22 MED ORDER — ONDANSETRON HCL 4 MG PO TABS: 4.0000 mg | ORAL_TABLET | ORAL | Status: DC | PRN

## 2016-09-22 MED ORDER — DOCUSATE SODIUM 100 MG PO CAPS
100.0000 mg | ORAL_CAPSULE | Freq: Every day | ORAL | Status: DC
  Administered 2016-09-22 – 2016-09-23 (×2): 100 mg via ORAL
  Filled 2016-09-22 (×2): qty 1

## 2016-09-22 MED ORDER — SIMETHICONE 80 MG PO CHEW: 80.0000 mg | CHEWABLE_TABLET | ORAL | Status: DC | PRN

## 2016-09-22 MED ORDER — ACETAMINOPHEN 325 MG PO TABS
650.0000 mg | ORAL_TABLET | ORAL | Status: DC | PRN
  Administered 2016-09-23: 650 mg via ORAL
  Filled 2016-09-22: qty 2

## 2016-09-22 MED ORDER — SENNOSIDES-DOCUSATE SODIUM 8.6-50 MG PO TABS
2.0000 | ORAL_TABLET | ORAL | Status: DC
  Administered 2016-09-22 – 2016-09-23 (×2): 2 via ORAL
  Filled 2016-09-22 (×2): qty 2

## 2016-09-22 MED ORDER — IBUPROFEN 600 MG PO TABS
600.0000 mg | ORAL_TABLET | Freq: Four times a day (QID) | ORAL | Status: DC
  Administered 2016-09-22 – 2016-09-23 (×6): 600 mg via ORAL
  Filled 2016-09-22 (×6): qty 1

## 2016-09-22 NOTE — Lactation Note (Addendum)
This note was copied from a baby's chart. Lactation Consultation Note Baby 4 hrs old. FOB holding baby screaming. Mom resting in bed. [place] asked mom is it time for the baby to BF? Mom stated baby fed not long ago. Educated newborn feeding habits and behavior. Took baby from FOB. Burped baby and stopped crying. Laid baby in crib. Mom stated she wasn't sure if she had anything for the baby. Mom stated baby had spit up. Mom has wide space tubular breast approximately 3-4 fingers width apart. Rt. Breast has a nodule to outer Rt aspect of breast beside of areola. Feels approx. Size of dime. Mom states she has had it check several times and is ok. Hand expression taught w/easy flow of colostrum. Has some areola edema. Has severe edema to LE. Collected 5 ml. Nipple at the end of breast everted. Mom encouraged to feed baby 8-12 times/24 hours and with feeding cues. Educated newborn feeding habits, STS. I&O, cluster feeding, supply and demand. Mom states she may end up pumping totally if baby will not latch well. Plans on pumping some so spouse can feed baby as well. Discussed supply and demand. Mom has 2 DEBP at home. [place] brochure given w/resources, support groups and [place] services. Patient Name: Kristin [name] DTOIZ'T Date: 09/22/2016 Reason for consult: Initial assessment   Maternal Data Has patient been taught Hand Expression?: Yes Does the patient have breastfeeding experience prior to this delivery?: No  Feeding Feeding Type: Breast Fed  LATCH Score/Interventions Latch: Repeated attempts needed to sustain latch, nipple held in mouth throughout feeding, stimulation needed to elicit sucking reflex. Intervention(s): Adjust position;Assist with latch  Audible Swallowing: A few with stimulation Intervention(s): Skin to skin;Hand expression  Type of Nipple: Everted at rest and after stimulation  Comfort (Breast/Nipple): Soft / non-tender     Hold (Positioning): Assistance needed to correctly  position infant at breast and maintain latch. Intervention(s): Breastfeeding basics reviewed;Support Pillows;Position options;Skin to skin  LATCH Score: 7  Lactation Tools Discussed/Used     Consult Status Consult Status: Follow-up Date: 09/22/16 Follow-up type: In-patient    Deshawnda Acrey, Elta Guadeloupe 09/22/2016, 4:15 AM

## 2016-09-22 NOTE — Anesthesia Postprocedure Evaluation (Signed)
Anesthesia Post Note  Patient: MARGUERITTE GUTHRIDGE  Procedure(s) Performed: * No procedures listed *     Patient location during evaluation: Mother Baby Anesthesia Type: Epidural Level of consciousness: awake and alert Pain management: pain level controlled Vital Signs Assessment: post-procedure vital signs reviewed and stable Respiratory status: spontaneous breathing, nonlabored ventilation and respiratory function stable Cardiovascular status: stable Postop Assessment: no headache, no backache and epidural receding Anesthetic complications: no    Last Vitals:  Vitals:   09/22/16 0230 09/22/16 0640  BP: 112/60 124/65  Pulse: (!) 111 (!) 108  Resp: 18 18  Temp: 36.8 C 36.7 C    Last Pain:  Vitals:   09/22/16 0640  TempSrc: Oral  PainSc: 3    Pain Goal: Patients Stated Pain Goal: 3 (09/22/16 0640)               Riki Sheer

## 2016-09-22 NOTE — Progress Notes (Signed)
Post Partum Day 1 Subjective: no complaints, up ad lib, voiding and tolerating PO  Objective: Blood pressure 124/65, pulse (!) 108, temperature 98 F (36.7 C), temperature source Oral, resp. rate 18, last menstrual period 12/17/2015, SpO2 100 %, unknown if currently breastfeeding.  Physical Exam:  General: alert, cooperative, appears stated age and no distress Lochia: appropriate Uterine Fundus: firm Incision: healing well, no dehiscence DVT Evaluation: No evidence of DVT seen on physical exam.   Recent Labs  09/21/16 1341  HGB 11.8*  HCT 34.9*    Assessment/Plan: Plan for discharge tomorrow   LOS: 1 day   Koren Shiver 09/22/2016, 10:46 AM

## 2016-09-22 NOTE — Progress Notes (Signed)
MOB was referred for history of depression/anxiety.  Referral is screened out by Clinical Social Worker because none of the following criteria appear to apply and there are no reports impacting the pregnancy or her transition to the postpartum period. CSW does not deem it clinically necessary to further investigate at this time.  -History of anxiety/depression during this pregnancy, or of post-partum depression.  - Diagnosis of anxiety and/or depression within last 3 years.-  - History of depression due to pregnancy loss/loss of child or -MOB's symptoms are currently being treated with medication and/or therapy.  CSW met with MOD at bedside to complete assessment for hx of depression. Upon this writers arrival, MOB notes she dealt with depression many years ago and is currently no longer suffering. Additionally, MOB notes she has a good support system should PPD become an issue; however, she does not believe it will. This Probation officer educated MOB on baby blues and preventative measures.Please contact the Clinical Social Worker if needs arise or upon MOB request.   Oda Cogan, MSW, [facility]  Office: [phone]

## 2016-09-22 NOTE — Lactation Note (Addendum)
This note was copied from a baby's chart. Lactation Consultation Note  Patient Name: Kristin [name] [id number] Date: 09/22/2016 Reason for consult: Follow-up assessment;Difficult latch  Called to room at 18 hours of age for latch assist. Mom reports + breast changes during pregnancy and normal menstrual cycles prior to pregnancy.  Moms breasts and nipples are WNL, with previous note of nodule near areola.      Mom holding baby in football hold on right breast.  Baby on and off latching and fussy.  Lc changed diaper with void.  Lc assisted with positioning, baby opens mouth wide and pushed breast away with fists and appears to be tongue thrusting.  Mom easily hand pumped with 49mls collected at bedside.  LC allowed baby to suck on gloved finger.  Baby noted to tongue thrust and does not extend tongue past lower gumline.  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  Baby noted to have hight palate and does not tolerate suck training well. LC spoon fed baby that does not extend tongue well and not swallowing when given drops of EBM.   Lc attempted NS after discussion with mom.  [place] fit with #20 and mom is able to return demonstration of application.  Baby first refused and then settled into some sucking, some minutes of almost rhythmic sucking and no colostrum noted in NS. Mom is concerned and wants baby to get her EBM.  LC attempted syringe/finger feeding to allow baby to suck and swallow, baby does not tolerate well.  Baby did get about 65mls over the duration of feeding attempt.   [place] advised mom to work on suck training, mom to use bottle nipple as needed to get baby to suck and swallow, mom to use NS as desired and to post pump with DEBP to protect milk supply and offer EBM. LC reported to oncoming RN, Jerrye Beavers.   [place] set up DEBP with instructions, mom to begin pumping later.  Mom to call as needed.     Maternal Data Has patient been taught  Hand Expression?: Yes Does the patient have breastfeeding experience prior to this delivery?: No  Feeding Feeding Type: Breast Fed Length of feed:  (on and off for several minutes)  LATCH Score/Interventions Latch: Repeated attempts needed to sustain latch, nipple held in mouth throughout feeding, stimulation needed to elicit sucking reflex. Intervention(s): Adjust position;Assist with latch;Breast massage;Breast compression  Audible Swallowing: None  Type of Nipple: Everted at rest and after stimulation  Comfort (Breast/Nipple): Soft / non-tender     Hold (Positioning): Assistance needed to correctly position infant at breast and maintain latch. Intervention(s): Breastfeeding basics reviewed;Support Pillows;Position options;Skin to skin  LATCH Score: 6  Lactation Tools Discussed/Used Tools: Nipple Shields Nipple shield size: 20 Pump Review: Setup, frequency, and cleaning;Milk Storage Initiated by:: JS IBCLC Date initiated:: 09/22/16   Consult Status Consult Status: Follow-up Date: 09/23/16 Follow-up type: In-patient    Derrica Sieg, Justine Null 09/22/2016, 7:29 PM

## 2016-09-23 MED ORDER — SENNOSIDES-DOCUSATE SODIUM 8.6-50 MG PO TABS: 2.0000 | ORAL_TABLET | ORAL | 6 refills | Status: DC

## 2016-09-23 MED ORDER — HYDROCORTISONE ACE-PRAMOXINE 1-1 % RE FOAM: 1.0000 | Freq: Four times a day (QID) | RECTAL | 12 refills | Status: DC

## 2016-09-23 MED ORDER — HYDROCHLOROTHIAZIDE 25 MG PO TABS
25.0000 mg | ORAL_TABLET | Freq: Every day | ORAL | Status: DC
  Administered 2016-09-23: 25 mg via ORAL
  Filled 2016-09-23 (×2): qty 1

## 2016-09-23 MED ORDER — OXYCODONE-ACETAMINOPHEN 5-325 MG PO TABS: 2.0000 | ORAL_TABLET | ORAL | 0 refills | Status: DC | PRN

## 2016-09-23 MED ORDER — HYDROCHLOROTHIAZIDE 25 MG PO TABS: 25.0000 mg | ORAL_TABLET | Freq: Every day | ORAL | 0 refills | Status: DC

## 2016-09-23 MED ORDER — IBUPROFEN 600 MG PO TABS: 600.0000 mg | ORAL_TABLET | Freq: Four times a day (QID) | ORAL | 0 refills | Status: DC

## 2016-09-23 MED ORDER — WITCH HAZEL-GLYCERIN EX PADS: 1.0000 "application " | MEDICATED_PAD | CUTANEOUS | 12 refills | Status: DC | PRN

## 2016-09-23 NOTE — Lactation Note (Signed)
This note was copied from a baby's chart. Lactation Consultation Note New mom having difficulty waking baby to BF. Baby has no interest in BF, suckling on gloved finger, or bottle nipple.  Mom using DEBP for supplementation. Pumped 10 ml. Encouraged hand expression after DEBP. LC collected 4 ml. Difficult to get baby to take 4 ml in slow flow bottle. Baby didn't want to suck. Finally drank it, stop and cry, drank, stop and cry until colostrum gone. After 4 ml given rested in bed quietly went to sleep. Gave mom feeding information sheet on amount baby should have at hours of age.  Mom stated she gave pacifier to get baby to start sucking and would encourage to BF. Explained pacifiers are discouraged for 2 weeks until milk supply is established well.  Mom very tired. Mom doesn't want to give formula if at all possible, but realizes she may if baby doesn't soon start BF and if mom can't supply amount of colostrum baby needs.  Suck training w/gloved finger, wouldn't suckle. Appears to have tight frenulum.  Mom c/o sore nipples. Skin intact. Coconut given and applied w/q-tips. Encouraged to wake baby every three hours to BF, pump q 3 hrs, hand expression, and STS as much as possible.  Patient Name: Kristin [name] JMEQA'S Date: 09/23/2016 Reason for consult: Follow-up assessment;Difficult latch   Maternal Data    Feeding Feeding Type: Breast Milk (fed by Mom) Nipple Type: Slow - flow Length of feed: 0 min (very sleepy even after undressed & baby situps)  LATCH Score/Interventions Latch: Too sleepy or reluctant, no latch achieved, no sucking elicited. Intervention(s): Skin to skin;Teach feeding cues;Waking techniques Intervention(s): Adjust position;Assist with latch;Breast massage;Breast compression  Audible Swallowing: None Intervention(s): Skin to skin;Hand expression Intervention(s): Alternate breast massage  Type of Nipple: Everted at rest and after stimulation  Comfort  (Breast/Nipple): Filling, red/small blisters or bruises, mild/mod discomfort  Problem noted: Mild/Moderate discomfort Interventions (Mild/moderate discomfort): Post-pump;Hand expression;Hand massage  Hold (Positioning): Full assist, staff holds infant at breast Intervention(s): Breastfeeding basics reviewed;Support Pillows;Position options;Skin to skin  LATCH Score: 3  Lactation Tools Discussed/Used Tools: Shells;Nipple [place] Fuel;Pump Shell Type: Inverted Breast pump type: Double-Electric Breast Pump   Consult Status Consult Status: Follow-up Date: 09/23/16 Follow-up type: In-patient    Daphna Lafuente, Elta Guadeloupe 09/23/2016, 7:25 AM

## 2016-09-23 NOTE — Lactation Note (Signed)
This note was copied from a baby's chart. Lactation Consultation Note  Baby 42 hours old.  Baby having difficult time latching. Mother attempting to latch frustrated upon entering. Noted short posterior lingual frenulum. Allowed baby to suck on LC gloved finger to get in a pattern while mother hand expressed. Biting felt. Assisted w/ latching baby in cross cradle hold.  Baby sustained latch for approx 8 min and became fussy. Applied #20NS and repositioned baby to football hold and baby latched for an additional 7 min and fell asleep. Mother is pumping after feeding sessions and gives volume back to baby. Discussed hand expression before and after pumping and hands on pumping to increase volume pumped. Provided family with resources for frenulum and encouraged them to speak to Pediatrician.     Patient Name: Kristin [name] [id number]'O Date: 09/23/2016 Reason for consult: Follow-up assessment   Maternal Data    Feeding Feeding Type: Breast Fed Length of feed: 15 min  LATCH Score/Interventions Latch: Repeated attempts needed to sustain latch, nipple held in mouth throughout feeding, stimulation needed to elicit sucking reflex. Intervention(s): Skin to skin Intervention(s): Adjust position;Assist with latch;Breast massage;Breast compression  Audible Swallowing: A few with stimulation Intervention(s): Skin to skin;Hand expression Intervention(s): Alternate breast massage  Type of Nipple: Everted at rest and after stimulation  Comfort (Breast/Nipple): Filling, red/small blisters or bruises, mild/mod discomfort  Problem noted: Mild/Moderate discomfort Interventions (Mild/moderate discomfort):  (coconut oil)  Hold (Positioning): Assistance needed to correctly position infant at breast and maintain latch.  LATCH Score: 6  Lactation Tools Discussed/Used Tools: Nipple Shields Nipple shield size: 20 (at end of feeding used NS) Shell Type: Inverted Breast pump type:  Double-Electric Breast Pump   Consult Status Consult Status: Follow-up Date: 09/24/16 Follow-up type: In-patient    Vivianne Master [facility] 09/23/2016, 10:41 AM

## 2016-09-23 NOTE — Lactation Note (Addendum)
This note was copied from a baby's chart. Lactation Consultation Note  Assisted w/ latch in both football and cross cradle.  Baby seems to have nose congestion, he comes off and on breast during feeding. Mother recently pumped approx 6 ml with manual pump which was given to baby with a bottle.  Baby takes bottle best with chin support. Attempted latching with nipple shield but baby still having difficulty sustaining a pattern.   Patient Name: Kristin [name] Date: 09/23/2016 Reason for consult: Follow-up assessment   Maternal Data    Feeding Feeding Type: Breast Fed Length of feed: 12 min  LATCH Score/Interventions Latch: Repeated attempts needed to sustain latch, nipple held in mouth throughout feeding, stimulation needed to elicit sucking reflex.  Audible Swallowing: A few with stimulation Intervention(s): Skin to skin;Hand expression Intervention(s): Alternate breast massage  Type of Nipple: Everted at rest and after stimulation  Comfort (Breast/Nipple): Filling, red/small blisters or bruises, mild/mod discomfort  Problem noted: Mild/Moderate discomfort Interventions (Mild/moderate discomfort): Hand expression (coconut oil)  Hold (Positioning): Assistance needed to correctly position infant at breast and maintain latch.  LATCH Score: 6  Lactation Tools Discussed/Used     Consult Status Consult Status: Follow-up Date: 09/24/16 Follow-up type: In-patient    Vivianne Master [facility] 09/23/2016, 2:31 PM

## 2016-09-23 NOTE — Discharge Summary (Signed)
OB Discharge Summary  Patient Name: Kristin [name] DOB: Nov 25, 1980 MRN: [id number]  Date of admission: 09/21/2016 Delivering MD: Jacquiline Doe MICHAEL   Date of discharge: 09/23/2016  Admitting diagnosis: LABOR Intrauterine pregnancy: [redacted]w[redacted]d     Secondary diagnosis:Active Problems:   PROM (premature rupture of membranes)  Additional problems:none     Discharge diagnosis: Term Pregnancy Delivered                                                                     Post partum procedures:n/a  Augmentation: Pitocin  Complications: None  Hospital course:  Onset of Labor With Vaginal Delivery     36 y.o. yo G1P1001 at [redacted]w[redacted]d was admitted in Latent Labor on 09/21/2016. Patient had an uncomplicated labor course as follows:  Membrane Rupture Time/Date: 10:09 AM ,09/21/2016   Intrapartum Procedures: Episiotomy: None [1]                                         Lacerations:  3rd degree [4];Perineal [11]  Patient had a delivery of a Viable infant. 09/21/2016  Information for the patient's newborn:  Natsha, Guidry [[id number]]  Delivery Method: Vaginal, Spontaneous Delivery (Filed from Delivery Summary)    Pateint had an uncomplicated postpartum course.  She is ambulating, tolerating a regular diet, passing flatus, and urinating well. Patient is discharged home in stable condition on 09/23/16.   Physical exam  Vitals:   09/22/16 0230 09/22/16 0640 09/22/16 1800 09/23/16 0515  BP: 112/60 124/65 126/76 124/81  Pulse: (!) 111 (!) 108 87 80  Resp: 18 18 19 20   Temp: 98.3 F (36.8 C) 98 F (36.7 C) 98.2 F (36.8 C) 98.2 F (36.8 C)  TempSrc: Oral Oral Oral Oral  SpO2:  100% 100% 99%   General: alert, cooperative and no distress Lochia: appropriate Uterine Fundus: firm Incision: Healing well with no significant drainage, No significant erythema DVT Evaluation: No evidence of DVT seen on physical exam. Labs: Lab Results  Component Value Date   WBC 15.8 (H) 09/21/2016    HGB 11.8 (L) 09/21/2016   HCT 34.9 (L) 09/21/2016   MCV 86.4 09/21/2016   PLT 292 09/21/2016   CMP Latest Ref Rng & Units 02/20/2016  Glucose 65 - 99 mg/dL 95  BUN 6 - 23 mg/dL -  Creatinine 0.50 - 1.10 mg/dL -  Sodium 137 - 147 mEq/L -  Potassium 3.7 - 5.3 mEq/L -  Chloride 96 - 112 mEq/L -  CO2 19 - 32 mEq/L -  Calcium 8.4 - 10.5 mg/dL -  Total Protein 6.0 - 8.3 g/dL -  Total Bilirubin 0.3 - 1.2 mg/dL -  Alkaline Phos 39 - 117 U/L -  AST 0 - 37 U/L -  ALT 0 - 35 U/L -    Discharge instruction: per After Visit Summary and "Baby and Me Booklet".  After Visit Meds:  Allergies as of 09/23/2016      Reactions   Vicodin [hydrocodone-acetaminophen] Nausea And Vomiting      Medication List    STOP taking these medications   hydrOXYzine 25 MG capsule Commonly known as:  VISTARIL   SUMAtriptan 100 MG tablet Commonly known as:  IMITREX     TAKE these medications   FISH OIL PO Take 1 capsule by mouth at bedtime.   hydrochlorothiazide 25 MG tablet Commonly known as:  HYDRODIURIL Take 1 tablet (25 mg total) by mouth daily.   hydrocortisone-pramoxine rectal foam Commonly known as:  PROCTOFOAM HC Place 1 applicator rectally 4 (four) times daily. What changed:  when to take this   ibuprofen 600 MG tablet Commonly known as:  ADVIL,MOTRIN Take 1 tablet (600 mg total) by mouth every 6 (six) hours.   omeprazole 40 MG capsule Commonly known as:  PRILOSEC Take 40 mg by mouth at bedtime.   oxyCODONE-acetaminophen 5-325 MG tablet Commonly known as:  PERCOCET/ROXICET Take 2 tablets by mouth every 4 (four) hours as needed for severe pain.   prenatal multivitamin Tabs tablet Take 1 tablet by mouth at bedtime.   senna-docusate 8.6-50 MG tablet Commonly known as:  Senokot-S Take 2 tablets by mouth daily. Start taking on:  09/24/2016   witch hazel-glycerin pad Commonly known as:  TUCKS Apply 1 application topically as needed for hemorrhoids.       Diet: routine  diet  Activity: Advance as tolerated. Pelvic rest for 6 weeks.   Outpatient follow up:6 weeks Follow up Appt:No future appointments. Follow up visit: No Follow-up on file.  Postpartum contraception: Nexplanon  Newborn Data: Live born female  Birth Weight: 8 lb 8.3 oz (3864 g) APGAR: 2, 8  Baby Feeding: Breast Disposition:home with mother   09/23/2016 Koren Shiver, CNM

## 2016-09-23 NOTE — Plan of Care (Signed)
Problem: Bowel/Gastric: Goal: Gastrointestinal status will improve Outcome: Progressing Patient has good bowel sounds but has not had a bowel movement since delivery.

## 2016-09-24 ENCOUNTER — Ambulatory Visit: Payer: Self-pay

## 2016-09-24 NOTE — Lactation Note (Signed)
This note was copied from a baby's chart. Lactation Consultation Note  Patient Name: Kristin [name] [id number] Date: 09/24/2016 Reason for consult: Follow-up assessment;Difficult latch Mom called for LC to come observe feeding before d/c home. Baby still sleepy from circumcision and would not latch. Feeding plan discussed for d/c home.  Continue to offer breast with each feeding, use 20 nipple shield as needed with latch. If using nipple shield to latch, be sure to look for breast milk in nipple shield with feedings. Advised baby should be at breast 8-12 times in 24 hours and with feeding ques.  Try to keep baby nursing for 15-20 minutes 1 breast per feeding till milk comes to volume or till baby sustaining latch better.  Alternate breast each feeding. FOB to give supplement per guideline per hours of age - encouraged to use DR. Brown #1 bottle. Mom to post pump every 3 hours for 15-20 minutes. Pump 1 time during night. This is to encourage milk production, prevent engorgement and protect milk supply.  Engorgement care discussed if needed, refer to PP booklet, page 17. Breast milk storage discussed, refer to PP booklet, page 36. Keep OP f/u Thursday 09/27/16. Call for questions/concerns.    Maternal Data    Feeding Feeding Type: Breast Fed Length of feed: 0 min  LATCH Score/Interventions Latch: Too sleepy or reluctant, no latch achieved, no sucking elicited. Intervention(s): Adjust position;Assist with latch;Breast massage;Breast compression  Audible Swallowing: None  Type of Nipple: Everted at rest and after stimulation  Comfort (Breast/Nipple): Soft / non-tender     Hold (Positioning): Assistance needed to correctly position infant at breast and maintain latch.  LATCH Score: 6  Lactation Tools Discussed/Used Tools: Pump;70F feeding tube / Syringe Nipple shield size: 20 Breast pump type: Double-Electric Breast Pump   Consult Status Consult Status: Complete Date:  09/24/16 Follow-up type: In-patient    Katrine Coho 09/24/2016, 1:56 PM

## 2016-09-24 NOTE — Lactation Note (Signed)
This note was copied from a baby's chart. Lactation Consultation Note  Patient Name: Kristin [name] [id number] Date: 09/24/2016 Reason for consult: Follow-up assessment;Difficult latch Baby back from circumcision and very fussy. Would not settle down to latch. Mom gave baby 7 ml of formula via bottle. Baby settled and Mom was able to latch baby to right breast in football hold. Baby demonstrated few good suckling bursts, dimpling noted, no audible swallows then became sleepy after about 5 minutes. Tried to add supplement using 5 fr feeding tube/syringe with nipple shield at breast but baby would not suckle. Baby would take bottle to complete the feeding. Scheduled OP f/u for Mom on Thursday, 09/27/16 at 3:30. LC does not short, anterior lingual frenulum, high palate. Left Mom pumping, Mom is supposed to call with next feeding for LC assist.   Maternal Data    Feeding Feeding Type: Bottle Fed - Formula Length of feed: 5 min  LATCH Score/Interventions Latch: Repeated attempts needed to sustain latch, nipple held in mouth throughout feeding, stimulation needed to elicit sucking reflex. Intervention(s): Adjust position;Assist with latch;Breast massage;Breast compression  Audible Swallowing: None  Type of Nipple: Everted at rest and after stimulation  Comfort (Breast/Nipple): Soft / non-tender     Hold (Positioning): Assistance needed to correctly position infant at breast and maintain latch.  LATCH Score: 6  Lactation Tools Discussed/Used Tools: Nipple Shields;Pump;34F feeding tube / Syringe Nipple shield size: 20 Breast pump type: Double-Electric Breast Pump   Consult Status Consult Status: Follow-up Date: 09/24/16 Follow-up type: In-patient    Kristin [name] 09/24/2016, 12:15 PM

## 2016-09-24 NOTE — Lactation Note (Signed)
This note was copied from a baby's chart. Lactation Consultation Note  Patient Name: Kristin [name] [id number] Date: 09/24/2016 Reason for consult: Follow-up assessment;Difficult latch Baby awake and giving feeding ques but once at breast would not suckle with or without the nipple shield. Tried to engage baby by supplementing at breast using 5 fr feeding tube/syringe but baby not interested. Baby had recently had bottle with formula. Mom reports she has been able to latch baby without the nipple shield. She is supplementing with EBM when available and Alimentum. Baby has not had stool in over 24 hours, 1 stool in life. 2 voids past 24 hours/7 in life. Baby has gained weight 3.6 oz since yesterday and Mom supplementing more volume. Baby to be circumcised before d/c today. LC encouraged Mom to call for LC to observe latch and help parents with feeding plan for d/c home. Also encouraged OP f/u later this week once Mom's milk is in. [facility] left phone number for Mom to call with next feeding.   Maternal Data    Feeding Feeding Type: Breast Fed Length of feed: 0 min  LATCH Score/Interventions Latch: Too sleepy or reluctant, no latch achieved, no sucking elicited. Intervention(s): Adjust position;Assist with latch;Breast massage;Breast compression  Audible Swallowing: None  Type of Nipple: Everted at rest and after stimulation  Comfort (Breast/Nipple): Soft / non-tender     Hold (Positioning): Assistance needed to correctly position infant at breast and maintain latch.  LATCH Score: 5  Lactation Tools Discussed/Used     Consult Status Consult Status: Follow-up Date: 09/24/16 Follow-up type: In-patient    Katrine Coho 09/24/2016, 9:27 AM

## 2016-09-27 ENCOUNTER — Ambulatory Visit: Payer: Self-pay

## 2016-09-27 NOTE — Lactation Note (Signed)
This note was copied from a baby's chart. Lactation Consult  Mother's reason for visit:  "tongue tie", latch help  Visit Type:  OP Consult:  Follow-Up Lactation Consultant:  Denzil Hughes  ________________________________________________________________________ Birth wt 8lb 8.3 oz Lowest wt 8lb 0oz   D/C wt on 09/24/16 8lb 3oz  At peds office on 09/25/16 8lb 6oz    ________________________________________________________________________  Mother's Name: Kristin [name] Type of delivery:  Vaginal, Spontaneous Delivery Maternal Medical Conditions:  none Maternal Medications:  Omeprazole, prenatal vitamin, fish oil, calcium supplement, "fluid pill"    ________________________________________________________________________  Breastfeeding History (Post Discharge)  Frequency of breastfeeding:  1 Duration of feeding:  20 minutes   Supplementation  Formula:  Volume 1.5-2 oz Frequency:  2-3 hr  Total volume per day:  22ml       Brand: Similac  Breastmilk:  Volume 1.5-2oz Frequency:  2-3 hr Total volume per day:  263ml  Method:  Bottle,   Pumping  Type of pump:  Medela pump in style Frequency:  2-3 hr Volume:  About 1.5 oz total   Infant Intake and Output Assessment  Voids:  8 in 24 hrs.  Color:  Clear yellow Stools:  1 in 24 hrs.  Color:  Yellow  ________________________________________________________________________  _______________________________________________________________________ Feeding Assessment/Evaluation  Initial feeding assessment:  Attached assessment:  Shallow   Lips flanged:  No.  Lips untucked:  No.  Suck assessment:  Nutritive  Tools:  Nipple shield 24 mm Instructed on use and cleaning of tool:  Yes.    Pre-feed weight:  3866 g  (8 lb. 8.4 oz.) Post-feed weight:  3880 g Amount transferred:  14 ml Amount supplemented:  64ml Feeding started at 1550 and ended at 1615  Pre-feed weight:  3880 g  Post-feed weight:  3890  g Amount transferred:  10 ml Amount supplemented:  0 ml Feeding started at 1620 and ended at 1630  Bottle feed baby 49ml of expressed milk with a slow flow nipple in 15 minutes. Baby was sitting up and needed lots of jaw support to maintain a good seal around the nipple. He needed support flanging his lips with a bottle. Small amounts of milk leak from the bottom of his mouth when bottle feeding.    Total amount transferred:  24 ml Total supplement given:  25 ml Baby was very fussy at the breast. He arches his back even when not being fed. He does his best to keep his body long when being held. He has "a lot of gas" and "looks like he might spit up" after/between feedings. He is the same amount of unsettled with bottle feedings. Baby grimaces when eating. Mom will continue to work on latching. She will try the football on the R and cross cradle position on the L. She will continue to use the #24 NS. She will do Paced bottle feeding with a wide nipple bottle and maintain jaw support throughout the feeding. Mom will f/u with the "doctor [place] that does tongue ties" today. She will continue to post pump. If baby does not latch she will offer increasing amounts of expressed milk or formula. She will f/u with lactation as needed.

## 2016-10-23 ENCOUNTER — Ambulatory Visit (INDEPENDENT_AMBULATORY_CARE_PROVIDER_SITE_OTHER): Payer: 59 | Admitting: Obstetrics & Gynecology

## 2016-10-23 ENCOUNTER — Encounter: Payer: Self-pay | Admitting: Obstetrics & Gynecology

## 2016-10-23 VITALS — BP 111/72 | HR 66 | Resp 16 | Ht 67.0 in | Wt 234.0 lb

## 2016-10-23 DIAGNOSIS — N644 Mastodynia: Secondary | ICD-10-CM

## 2016-10-23 DIAGNOSIS — IMO0001 Reserved for inherently not codable concepts without codable children: Secondary | ICD-10-CM

## 2016-10-23 DIAGNOSIS — Z3202 Encounter for pregnancy test, result negative: Secondary | ICD-10-CM

## 2016-10-23 LAB — CBC
HEMATOCRIT: 38 % (ref 35.0–45.0)
HEMOGLOBIN: 12.2 g/dL (ref 11.7–15.5)
MCH: 28 pg (ref 27.0–33.0)
MCHC: 32.1 g/dL (ref 32.0–36.0)
MCV: 87.4 fL (ref 80.0–100.0)
MPV: 8.4 fL (ref 7.5–12.5)
Platelets: 385 10*3/uL (ref 140–400)
RBC: 4.35 MIL/uL (ref 3.80–5.10)
RDW: 13.8 % (ref 11.0–15.0)
WBC: 9 10*3/uL (ref 3.8–10.8)

## 2016-10-23 MED ORDER — METOCLOPRAMIDE HCL 10 MG PO TABS: 10.0000 mg | ORAL_TABLET | Freq: Three times a day (TID) | ORAL | 0 refills | Status: DC | PRN

## 2016-10-23 MED ORDER — AMBULATORY NON FORMULARY MEDICATION: 5 refills | Status: DC

## 2016-10-23 NOTE — Progress Notes (Signed)
Post Partum Exam  OTHA [name] is a 36 y.o. [id number] female who presents for a postpartum visit. She is 4 weeks postpartum following a spontaneous vaginal delivery. I have fully reviewed the prenatal and intrapartum course. The delivery was at 40 gestational weeks.  Anesthesia: epidural. Postpartum course has been unremarkable except that her milk supply has decreased . Baby's course has been unremarkable. Baby is feeding by both breast and bottle - Similac Alimentum. Bleeding red. Bowel function is normal. Bladder function is normal. Patient is not sexually active. Contraception method is unsure. Postpartum depression screening:neg  Pt was producing 4 ounces per feed, but decreased to 2 ounces when menses started on Saturday.  Pt taking supplements, drinking 1 gallon of water a day, taking mother's milk tea.    Pt not sexually active yet.  Wants Mirena Pt feels legs are almost back to normal although extremities look equally swollen.    The following portions of the patient's history were reviewed and updated as appropriate: allergies, current medications, past family history, past medical history, past social history, past surgical history and problem list.  Review of Systems Pertinent items noted in HPI and remainder of comprehensive ROS otherwise negative.    Objective:  Blood pressure 111/72, pulse 66, resp. rate 16, height 5\' 7"  (1.702 m), weight 234 lb (106.1 kg), currently breastfeeding.  General:  alert, cooperative and no distress   Breasts:  inspection negative, no nipple discharge or bleeding, no masses or nodularity palpable and red bump on right areola.  Not painful.    Lungs: clear to auscultation bilaterally  Heart:  regular rate and rhythm  Abdomen: soft, non-tender; bowel sounds normal; no masses,  no organomegaly   Vulva:  normal  Vagina: blood in valult; no lesion; suture know still present.  Intact perineum  Cervix:  no lesions  Corpus: normal size, contour, position,  consistency, mobility, non-tender  Adnexa:  no mass, fullness, tenderness  Rectal Exam: external hemorrhoids        Assessment:   Postpartum exam Swollen LE--no evidence of DVT  Plan:   1. Contraception: IUD--to be placed in 2 weeks.  Refrain from intercourse for 2 more weeks, letting perineum heal. 2. Reglan as a galatagogue 3. Follow up in: 2 weeks or as needed.

## 2016-10-24 ENCOUNTER — Telehealth: Payer: Self-pay | Admitting: *Deleted

## 2016-10-24 LAB — TSH: TSH: 1.46 mIU/L

## 2016-10-24 NOTE — Telephone Encounter (Signed)
-----   Message from Guss Bunde, MD sent at 10/24/2016  1:38 PM EDT ----- Not anemic and nm lthyroid.  RN to call.

## 2016-10-24 NOTE — Telephone Encounter (Signed)
LM on voicemail of normal labs drawn yesterday.

## 2016-11-12 ENCOUNTER — Ambulatory Visit (INDEPENDENT_AMBULATORY_CARE_PROVIDER_SITE_OTHER): Payer: 59 | Admitting: Obstetrics & Gynecology

## 2016-11-12 ENCOUNTER — Encounter: Payer: Self-pay | Admitting: *Deleted

## 2016-11-12 ENCOUNTER — Encounter: Payer: Self-pay | Admitting: Obstetrics & Gynecology

## 2016-11-12 VITALS — BP 111/71 | HR 71 | Resp 16 | Ht 67.0 in | Wt 232.0 lb

## 2016-11-12 DIAGNOSIS — K649 Unspecified hemorrhoids: Secondary | ICD-10-CM

## 2016-11-12 DIAGNOSIS — N644 Mastodynia: Secondary | ICD-10-CM

## 2016-11-12 DIAGNOSIS — Z3202 Encounter for pregnancy test, result negative: Secondary | ICD-10-CM

## 2016-11-12 DIAGNOSIS — Z9889 Other specified postprocedural states: Secondary | ICD-10-CM

## 2016-11-12 DIAGNOSIS — Z30430 Encounter for insertion of intrauterine contraceptive device: Secondary | ICD-10-CM

## 2016-11-12 LAB — POCT URINE PREGNANCY: PREG TEST UR: NEGATIVE

## 2016-11-12 MED ORDER — LEVONORGESTREL 20 MCG/24HR IU IUD
INTRAUTERINE_SYSTEM | Freq: Once | INTRAUTERINE | Status: AC
  Administered 2016-11-12: 14:00:00 via INTRAUTERINE

## 2016-11-12 MED ORDER — HYDROCORTISONE 1 % RE CREA: 1.0000 | TOPICAL_CREAM | Freq: Two times a day (BID) | RECTAL | 3 refills | Status: DC

## 2016-11-12 NOTE — Progress Notes (Signed)
   Subjective:    Patient ID: Kristin [name], female    DOB: 1981-03-11, 36 y.o.   MRN: [id number]  HPI  36 yo female presents with left breast pain.  Pt feels like she has a clogged duct.  No fever, no rigors, no chils.  Still producing milk. Pt c/o hemorrhoids.  Using OTC without relief Pt for IUD today  Review of Systems  Constitutional: Negative for fatigue and fever.  Respiratory: Negative.   Cardiovascular: Negative.   Gastrointestinal: Negative for abdominal pain, blood in stool and rectal pain.  Genitourinary: Negative.   Psychiatric/Behavioral: Negative.        Objective:   Physical Exam  Constitutional: She is oriented to person, place, and time. She appears well-developed and well-nourished. No distress.  HENT:  Head: Normocephalic and atraumatic.  Eyes: Conjunctivae are normal.  Pulmonary/Chest: Effort normal.    Abdominal: Soft. Bowel sounds are normal. She exhibits no distension. There is no tenderness.  Genitourinary: Vagina normal.  Genitourinary Comments: Perinuem healed.  No suture visible. Cervix small (s/p Leep) Uterus-small, non tender   Musculoskeletal: She exhibits no edema.  Neurological: She is alert and oriented to person, place, and time.  Skin: Skin is warm and dry.  Psychiatric: She has a normal mood and affect.  Vitals reviewed.  Vitals:   11/12/16 1331  BP: 111/71  Pulse: 71  Resp: 16  Weight: 232 lb (105.2 kg)  Height: 5\' 7"  (1.702 m)        Assessment & Plan:  Left breast pain--continue hot compresses nad massage with pumping/breastfeeding.  If worsens or develop fever, will treat with Abx. Hemorroids--proctocort.\; consider referral to general surgery if not improved Resume intercourse  IUD Procedure Note Patient identified, informed consent performed.  Discussed risks of irregular bleeding, cramping, infection, malpositioning or misplacement of the IUD outside the uterus which may require further procedures. Time out was  performed.  Urine pregnancy test negative.  Speculum placed in the vagina.  Cervix visualized.  Cleaned with Betadine x 2.  Grasped anteriorly with a single tooth tenaculum.  Uterus sounded to 7.5 cm.  Mirena IUD placed per manufacturer's recommendations.  Strings trimmed to 3 cm. Tenaculum was removed, good hemostasis noted.  Patient tolerated procedure well.   Patient was given post-procedure instructions and the Mirena care card with expiration date.  Patient was also asked to check IUD strings periodically and follow up in 4-6 weeks for IUD check.

## 2016-12-19 ENCOUNTER — Ambulatory Visit: Payer: 59 | Admitting: Obstetrics & Gynecology

## 2017-02-26 ENCOUNTER — Encounter: Payer: Self-pay | Admitting: Family Medicine

## 2017-02-26 ENCOUNTER — Ambulatory Visit (INDEPENDENT_AMBULATORY_CARE_PROVIDER_SITE_OTHER): Payer: PRIVATE HEALTH INSURANCE | Admitting: Family Medicine

## 2017-02-26 VITALS — BP 132/82 | HR 83 | Ht 67.0 in | Wt 222.0 lb

## 2017-02-26 DIAGNOSIS — Z23 Encounter for immunization: Secondary | ICD-10-CM

## 2017-02-26 DIAGNOSIS — N6012 Diffuse cystic mastopathy of left breast: Secondary | ICD-10-CM | POA: Diagnosis not present

## 2017-02-26 DIAGNOSIS — Z803 Family history of malignant neoplasm of breast: Secondary | ICD-10-CM

## 2017-02-26 DIAGNOSIS — R21 Rash and other nonspecific skin eruption: Secondary | ICD-10-CM | POA: Diagnosis not present

## 2017-02-26 MED ORDER — DICLOXACILLIN SODIUM 250 MG PO CAPS: 250.0000 mg | ORAL_CAPSULE | Freq: Four times a day (QID) | ORAL | 0 refills | Status: DC

## 2017-02-26 NOTE — Patient Instructions (Signed)
Recommend trial of benzoyl peroxide for the rash.  If not improving after 1 week then please give me a call back.

## 2017-02-26 NOTE — Progress Notes (Addendum)
Subjective:    Patient ID: Kristin [name], female    DOB: 1980-10-19, 36 y.o.   MRN: [id number]  HPI Rash on her face for about 3 week. Has been treating it like acne. She has been using an acne wash and coconut cream. The skin is more dry but no crusting or drainage and not pustules.    She has a clogged milk duct. Mother and mat GM had BrCa. She has been trying ot massage the breast and has been pumping. She has been trying to apply head and get in the shower.  It is getting painful and sore and now noticing some redness over the skin.    She also gets an intermittent pain just below her right breast.  She said she talked to her previous provider about it and they thought maybe it was just gas pockets.  It is not always associated with eating but she wonders if it could be her gallbladder.  Her brother and I believe her mother have both had to have their gallbladders removed.  Review of Systems  Constitutional: Negative for diaphoresis, fever and unexpected weight change.  HENT: Negative for hearing loss, postnasal drip, sneezing and tinnitus.   Eyes: Negative for visual disturbance.  Respiratory: Negative for cough and wheezing.   Cardiovascular: Negative for chest pain and palpitations.  Genitourinary: Negative for vaginal bleeding and vaginal discharge.  Musculoskeletal: Negative for arthralgias.  Neurological: Negative for headaches.  Hematological: Negative for adenopathy. Does not bruise/bleed easily.     BP 132/82   Pulse 83   Ht _0  (1.702 m)   Wt 222 lb (100.7 kg)   SpO2 99%   BMI 34.77 kg/m     Allergies  Allergen Reactions  . Vicodin [Hydrocodone-Acetaminophen] Nausea And Vomiting    Past Medical History:  Diagnosis Date  . Abnormal Pap smear of cervix 2008  . Arthritis   . Depression    migrane  . History of dysmenorrhea   . Migraine   . Ulcer    stomach  . Vein, varicose     Past Surgical History:  Procedure Laterality Date  . LEEP  2008  .  WISDOM TOOTH EXTRACTION     x3    Social History   Socioeconomic History  . Marital status: Married    Spouse name: Roderic Palau  . Number of children: 4  . Years of education: Not on file  . Highest education level: Bachelor's degree (e.g., BA, AB, BS)  Social Needs  . Financial resource strain: Not on file  . Food insecurity - worry: Not on file  . Food insecurity - inability: Not on file  . Transportation needs - medical: Not on file  . Transportation needs - non-medical: Not on file  Occupational History  . Occupation: HOmemaker  Tobacco Use  . Smoking status: Former Smoker    Packs/day: 0.50    Types: Cigarettes    Last attempt to quit: 12/16/2015    Years since quitting: 1.2  . Smokeless tobacco: Never Used  Substance and Sexual Activity  . Alcohol use: No    Alcohol/week: 4.2 - 6.0 oz    Types: 7 - 10 Standard drinks or equivalent per week    Comment: " a few drinks a night"  . Drug use: No  . Sexual activity: Yes    Partners: Male    Birth control/protection: None, IUD  Other Topics Concern  . Not on file  Social History Narrative   Married  to [employer].  Children Westley Chandler, Judson Roch and [place] lives with her.    Family History  Problem Relation Age of Onset  . Breast cancer Mother 11       neg for BRCA gene  . Breast cancer Maternal Grandmother        breast  . Depression Maternal Grandfather   . Depression Paternal Grandmother   . Hyperlipidemia Paternal Grandmother   . Down syndrome Paternal Uncle   . Hyperlipidemia Father   . Hyperlipidemia Paternal Grandfather     Outpatient Encounter Medications as of 02/26/2017  Medication Sig  . Omega-3 Fatty Acids (FISH OIL PO) Take 1 capsule by mouth at bedtime.  Marland Kitchen omeprazole (PRILOSEC) 40 MG capsule Take 40 mg by mouth at bedtime.   . Prenatal Vit-Fe Fumarate-FA (PRENATAL MULTIVITAMIN) TABS tablet Take 1 tablet by mouth at bedtime.  . dicloxacillin (DYNAPEN) 250 MG capsule Take 1 capsule (250 mg  total) 4 (four) times daily by mouth.  . [DISCONTINUED] AMBULATORY NON FORMULARY MEDICATION All-purpose nipple cream with Ibuprofen.  Use on nipples after every feeding  . [DISCONTINUED] hydrocortisone (PROCTOCORT) 1 % CREA Apply topically.  . [DISCONTINUED] hydrocortisone (PROCTOCORT) 1 % CREA Apply 1 Dose topically 2 (two) times daily.  . [DISCONTINUED] metoCLOPramide (REGLAN) 10 MG tablet Take 1 tablet (10 mg total) by mouth every 8 (eight) hours as needed for nausea.  . [DISCONTINUED] senna-docusate (SENOKOT-S) 8.6-50 MG tablet Take 2 tablets by mouth daily.   No facility-administered encounter medications on file as of 02/26/2017.           Objective:   Physical Exam  Constitutional: She is oriented to person, place, and time. She appears well-developed and well-nourished.  HENT:  Head: Normocephalic and atraumatic.  Cardiovascular: Normal rate, regular rhythm and normal heart sounds.  Pulmonary/Chest: Effort normal and breath sounds normal.    Neurological: She is alert and oriented to person, place, and time.  Skin: Skin is warm and dry.  Fine erythematous papules on the chin. No pustules or crusting. She does have makeup on .   Psychiatric: She has a normal mood and affect. Her behavior is normal.       Assessment & Plan:   Rash - will tx with a benzoyl peroxide.  Call if nto better in one week. She did change makeup brands a few weeks ago.    Fam hx of BrCa - mother was brca tested and was negative.   Mastitis, left breast - will start dicloxacillin. Call if not improving, if getting worse and if fever.    Right upper quadrant pain - consider [place] if pain recurs. She will call and let us know.

## 2017-04-03 ENCOUNTER — Encounter: Payer: Self-pay | Admitting: Family Medicine

## 2017-04-03 ENCOUNTER — Ambulatory Visit (INDEPENDENT_AMBULATORY_CARE_PROVIDER_SITE_OTHER): Payer: PRIVATE HEALTH INSURANCE | Admitting: Family Medicine

## 2017-04-03 VITALS — BP 118/75 | HR 107

## 2017-04-03 DIAGNOSIS — M549 Dorsalgia, unspecified: Secondary | ICD-10-CM

## 2017-04-03 MED ORDER — IBUPROFEN 600 MG PO TABS: 600.0000 mg | ORAL_TABLET | Freq: Three times a day (TID) | ORAL | 0 refills | Status: DC | PRN

## 2017-04-03 MED ORDER — KETOROLAC TROMETHAMINE 60 MG/2ML IM SOLN
60.0000 mg | Freq: Once | INTRAMUSCULAR | Status: AC
  Administered 2017-04-03: 60 mg via INTRAMUSCULAR

## 2017-04-03 MED ORDER — DIAZEPAM 5 MG PO TABS: 2.5000 mg | ORAL_TABLET | Freq: Two times a day (BID) | ORAL | 1 refills | Status: DC | PRN

## 2017-04-03 NOTE — Patient Instructions (Addendum)
Is call me next week if your pain is not improving.  We can schedule Physical Therapy.

## 2017-04-03 NOTE — Progress Notes (Signed)
Subjective:    Patient ID: Kristin [name], female    DOB: Feb 04, 1981, 36 y.o.   MRN: [id number]  HPI 36 year old female comes in today for acute low back pain.  She says about a month and a half ago she was lifting her son out of the pack and play and had a sudden spasm in her back.  By the next day it was not as intense and it gradually got better over a couple of days.  And last night she was leaning forward expressing milk from her breasts when she suddenly felt a sharp pain in her low back.  Then when she tried to stand up straight it was an intense spasm.  She is almost in tears today from discomfort.  She did take some all in all but says it really did not help.  She try to sleep with a heating pad on.  When she woke up this morning pain was radiating down towards her tailbone.  The pain is mostly midline but it does radiate out bilaterally.  No radiation into the buttock or into the legs.  Is more comfortable to extend and more painful and limiting to flex the low back.   Review of Systems  BP 118/75   Pulse (!) 107   SpO2 96%     Allergies  Allergen Reactions  . Vicodin [Hydrocodone-Acetaminophen] Nausea And Vomiting    Past Medical History:  Diagnosis Date  . Abnormal Pap smear of cervix 2008  . Arthritis   . Depression    migrane  . History of dysmenorrhea   . Migraine   . Ulcer    stomach  . Vein, varicose     Past Surgical History:  Procedure Laterality Date  . LEEP  2008  . WISDOM TOOTH EXTRACTION     x3    Social History   Socioeconomic History  . Marital status: Married    Spouse name: Roderic Palau  . Number of children: 4  . Years of education: Not on file  . Highest education level: Bachelor's degree (e.g., BA, AB, BS)  Social Needs  . Financial resource strain: Not on file  . Food insecurity - worry: Not on file  . Food insecurity - inability: Not on file  . Transportation needs - medical: Not on file  . Transportation needs - non-medical: Not on  file  Occupational History  . Occupation: HOmemaker  Tobacco Use  . Smoking status: Former Smoker    Packs/day: 0.50    Types: Cigarettes    Last attempt to quit: 12/16/2015    Years since quitting: 1.2  . Smokeless tobacco: Never Used  Substance and Sexual Activity  . Alcohol use: No    Alcohol/week: 4.2 - 6.0 oz    Types: 7 - 10 Standard drinks or equivalent per week    Comment: " a few drinks a night"  . Drug use: No  . Sexual activity: Yes    Partners: Male    Birth control/protection: None, IUD  Other Topics Concern  . Not on file  Social History Narrative   Married to [employer].  Children Westley Chandler, Judson Roch and [place] lives with her.    Family History  Problem Relation Age of Onset  . Breast cancer Mother 58       neg for BRCA gene  . Breast cancer Maternal Grandmother        breast  . Depression Maternal Grandfather   . Depression Paternal Grandmother   .  Hyperlipidemia Paternal Grandmother   . Down syndrome Paternal Uncle   . Hyperlipidemia Father   . Hyperlipidemia Paternal Grandfather     Outpatient Encounter Medications as of 04/03/2017  Medication Sig  . Omega-3 Fatty Acids (FISH OIL PO) Take 1 capsule by mouth at bedtime.  Marland Kitchen omeprazole (PRILOSEC) 40 MG capsule Take 40 mg by mouth at bedtime.   . Prenatal Vit-Fe Fumarate-FA (PRENATAL MULTIVITAMIN) TABS tablet Take 1 tablet by mouth at bedtime.  . diazepam (VALIUM) 5 MG tablet Take 0.5-1 tablets (2.5-5 mg total) by mouth every 12 (twelve) hours as needed for muscle spasms.  Marland Kitchen ibuprofen (ADVIL,MOTRIN) 600 MG tablet Take 1 tablet (600 mg total) by mouth every 8 (eight) hours as needed for moderate pain.  . [DISCONTINUED] dicloxacillin (DYNAPEN) 250 MG capsule Take 1 capsule (250 mg total) 4 (four) times daily by mouth.  . [EXPIRED] ketorolac (TORADOL) injection 60 mg    No facility-administered encounter medications on file as of 04/03/2017.          Objective:   Physical Exam   Constitutional: She is oriented to person, place, and time. She appears well-developed and well-nourished.  HENT:  Head: Normocephalic and atraumatic.  Eyes: Conjunctivae and EOM are normal.  Cardiovascular: Normal rate.  Pulmonary/Chest: Effort normal.  Musculoskeletal:  Decreased lumbar extension to about 30 degrees.  Normal extension.  Normal rotation right left and normal side bending.  With straight regulates she had pain in her back but nothing radiating down her legs.  Hip, knee, ankle strength is 5 out of 5 bilaterally.  Patellar reflexes 1+ bilaterally.  Mildly tender over the midline lumbar spine and tender over the right SI joint.  Mildly tender over the lumbar paraspinous muscles bilaterally.  Neurological: She is alert and oriented to person, place, and time.  Skin: Skin is dry. No pallor.  Psychiatric: She has a normal mood and affect. Her behavior is normal.  Vitals reviewed.       Assessment & Plan:  Acute low back strain-reviewed some exercises to do on her own at home to slowly get the lumbar spine mobile.  Recommend ibuprofen 800 mg up to 3 times a day in addition to low-dose Valium.  Muscle relaxers overall were contraindicated with breast-feeding.  Given Toradol 60 mg IM injection for acute relief.  Avoid heavy lifting bending and twisting.  Can use heat or ice whichever feels better.

## 2017-04-05 ENCOUNTER — Other Ambulatory Visit: Payer: Self-pay | Admitting: Family Medicine

## 2017-05-29 ENCOUNTER — Telehealth: Payer: Self-pay

## 2017-05-29 NOTE — Telephone Encounter (Signed)
Left recommendation on pt's VM, callback provided for any questions.

## 2017-05-29 NOTE — Telephone Encounter (Signed)
She can use Coricidin HBP cough and cold if this will help dry up secretions and help suppress cough.  As far as the sore throat would recommend salt water gargles and tea with honey.  Recommend running a humidifier at night.

## 2017-05-29 NOTE — Telephone Encounter (Signed)
Kristin [name] called and stated she has itchy and scratchy throat and runny nose. She wants to know what OTC medication can she take while breast feeding. Please advise.

## 2017-08-09 IMAGING — DX DG SACRUM/COCCYX 2+V
3 series · 3 of 3 positions shown · non-contrast
Comparison: None.

CLINICAL DATA: Sacral pain for 3 weeks since falling down stairs.
Initial encounter.

EXAM:
SACRUM AND COCCYX - 2+ VIEW

[coccyx ap]
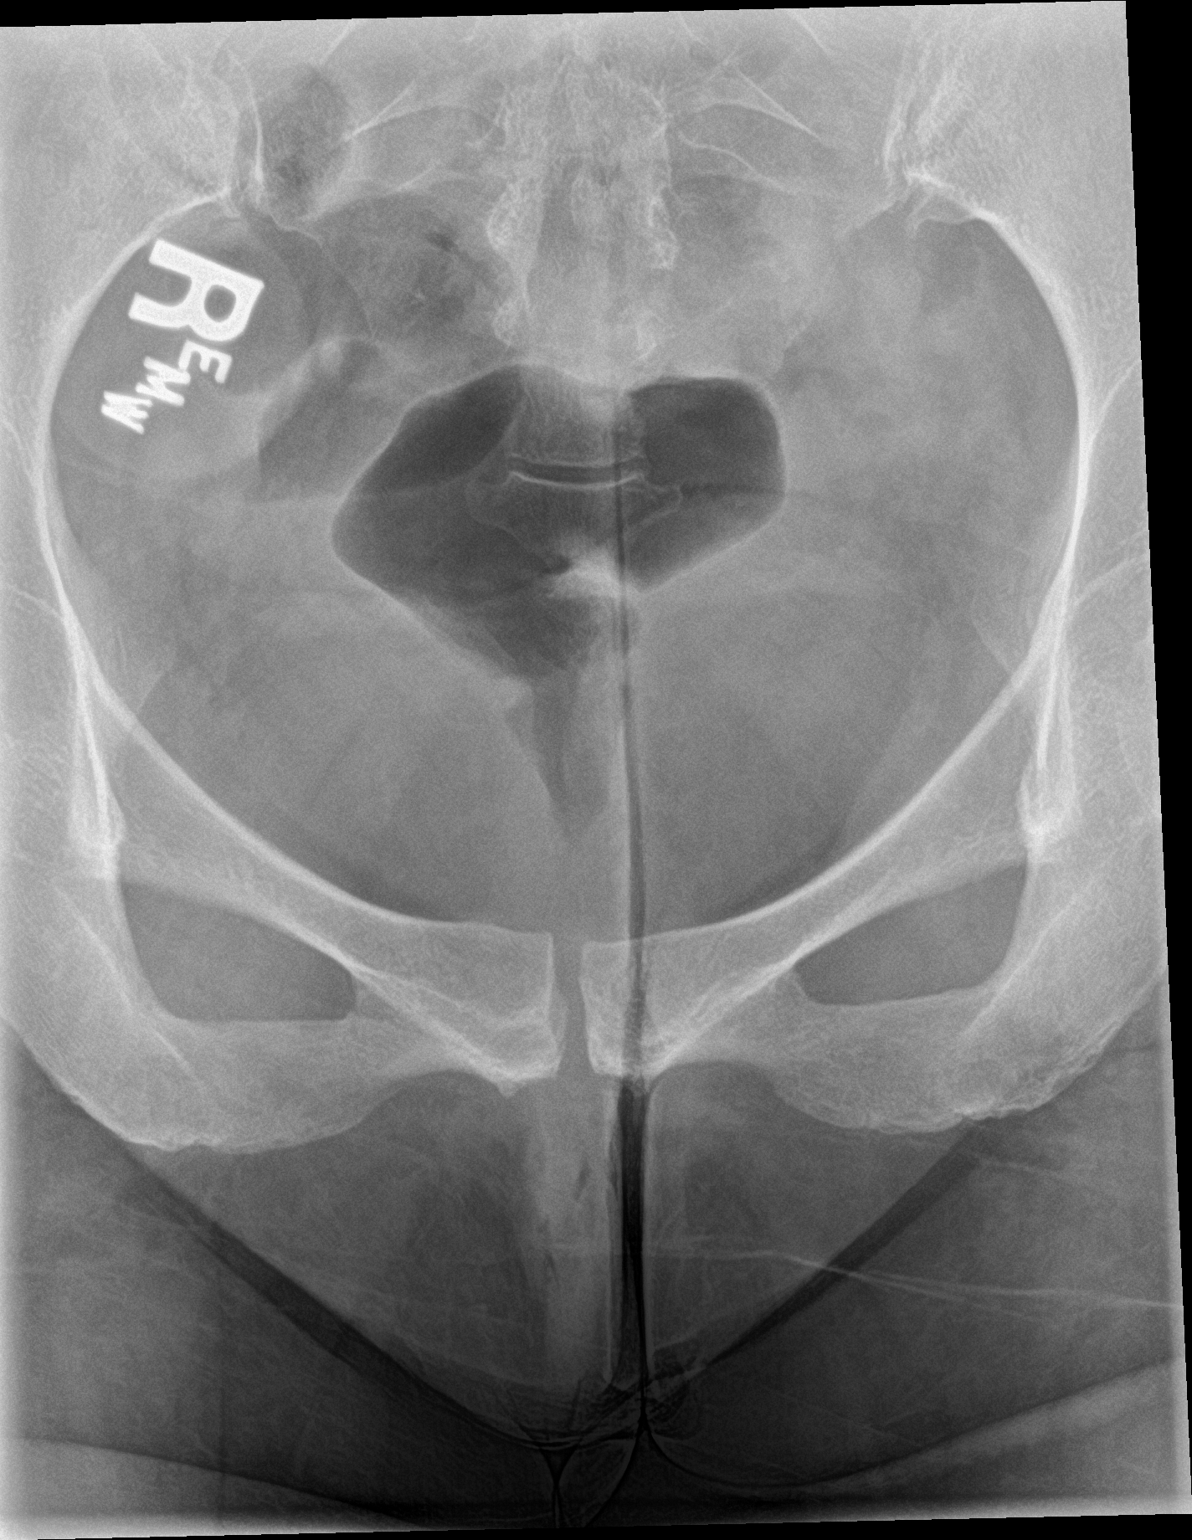

[sacrum ap]
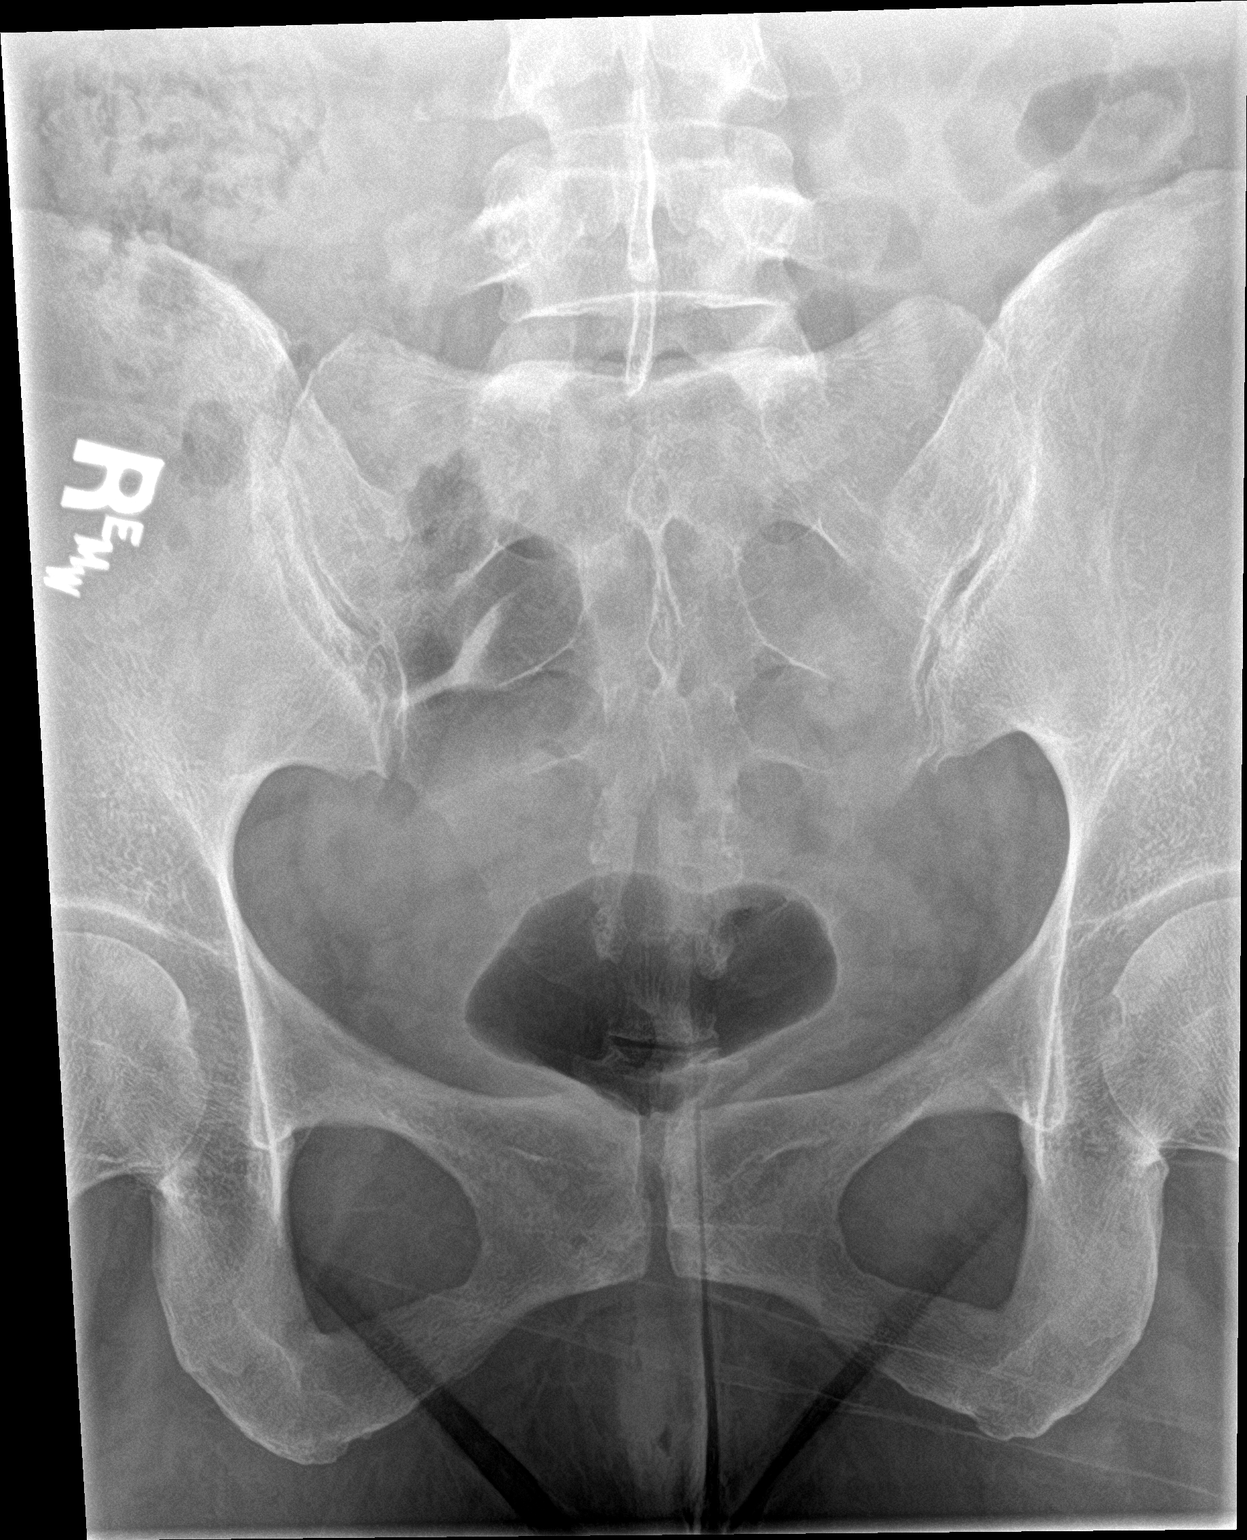

[sacrum lat]
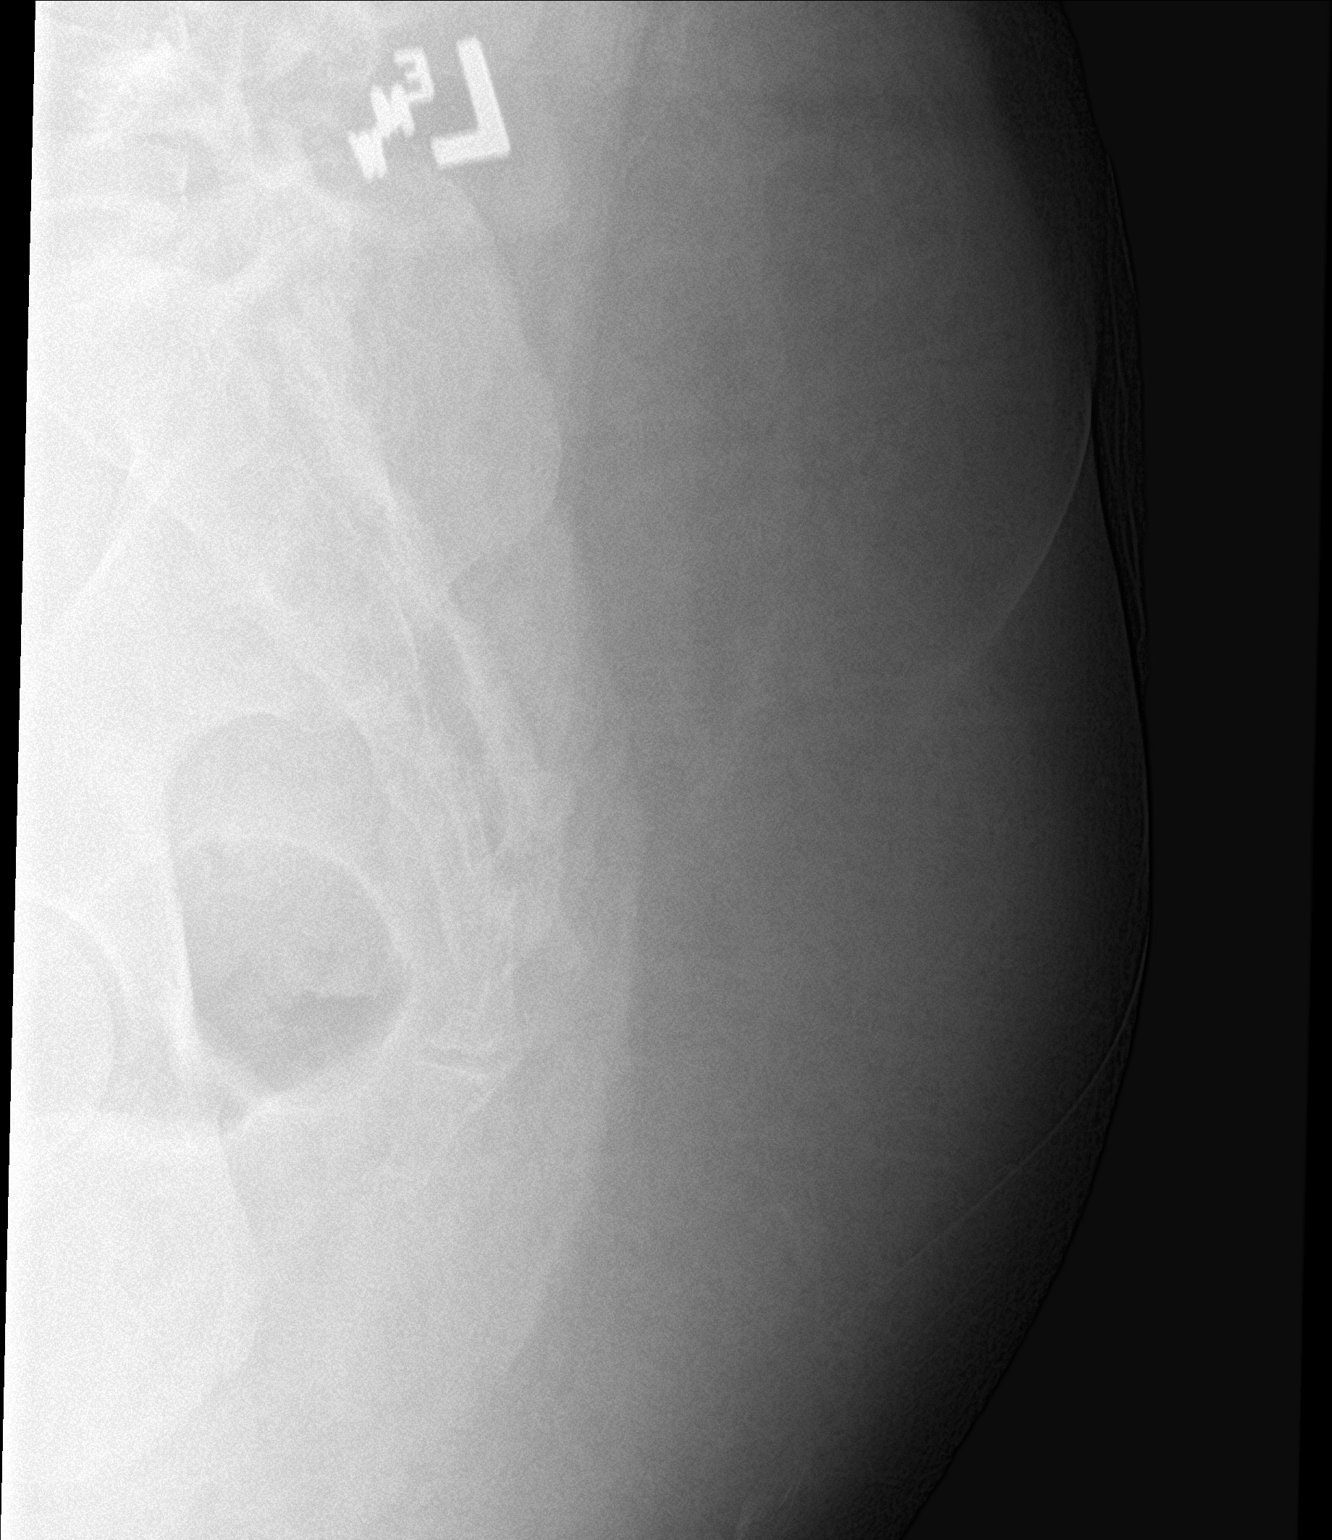

[3 of 3 positions shown; findings below may reference images not displayed]

FINDINGS: There is no evidence of fracture or other focal bone lesions.
IMPRESSION: Negative exam.

## 2018-03-22 IMAGING — US US MFM OB FOLLOW-UP
1 series · 14 of 28 positions shown · non-contrast
Comparison: none

[Series 1: us mfm ob follow-up · 70 acquisitions, 14 frames shown]
[im 3/70]
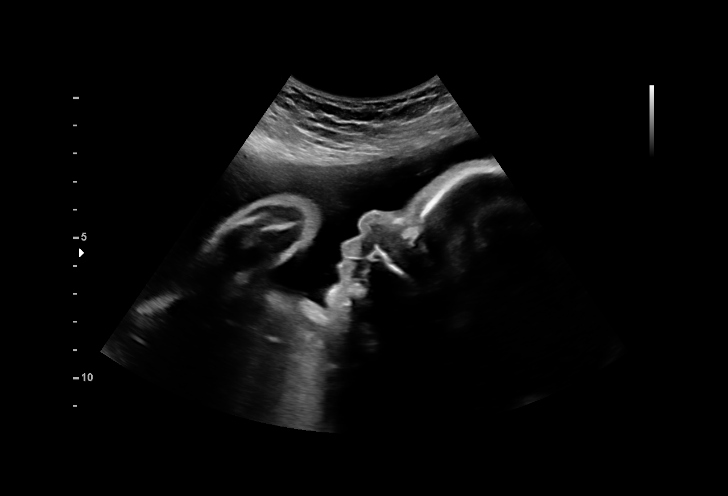
[im 8/70]
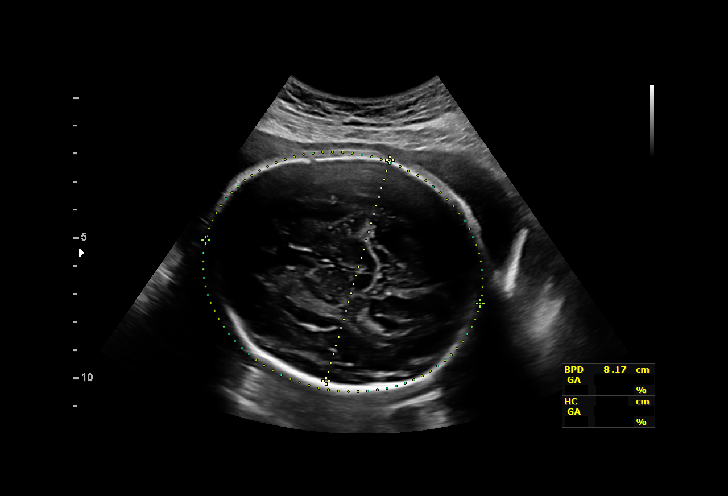
[im 13/70]
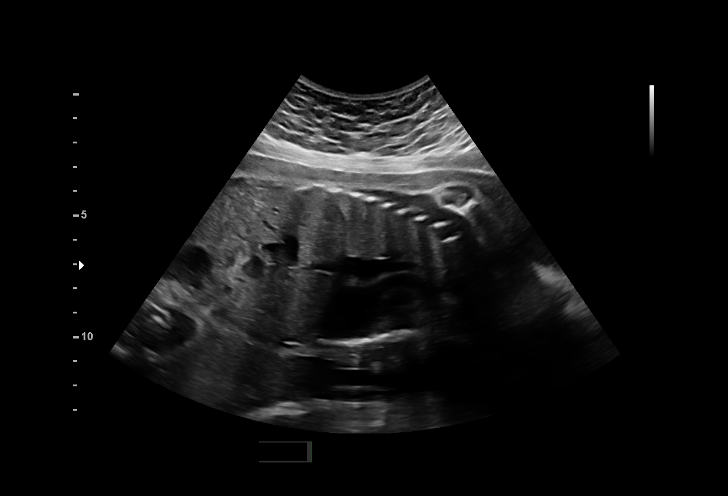
[im 18/70]
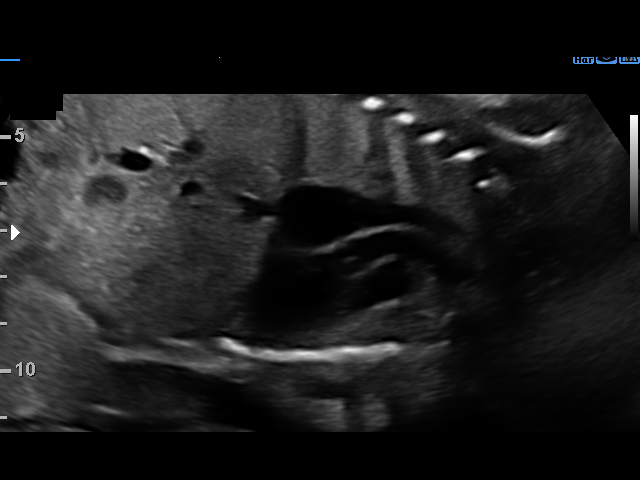
[im 24/70]
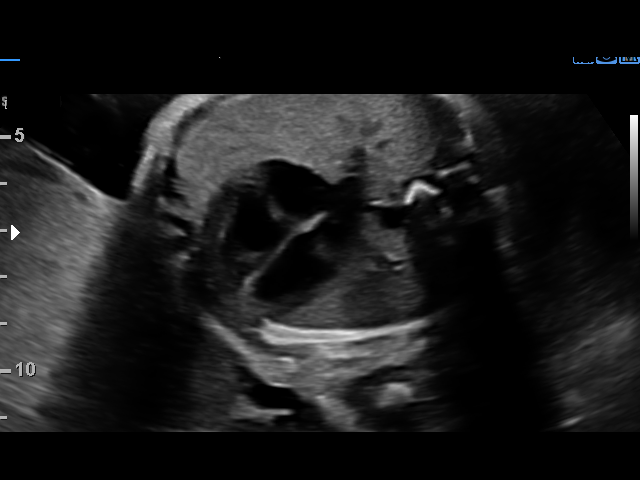
[im 29/70]
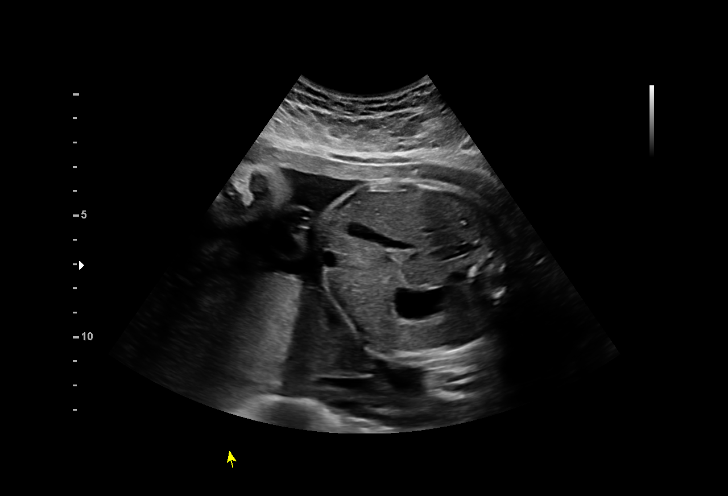
[im 34/70]
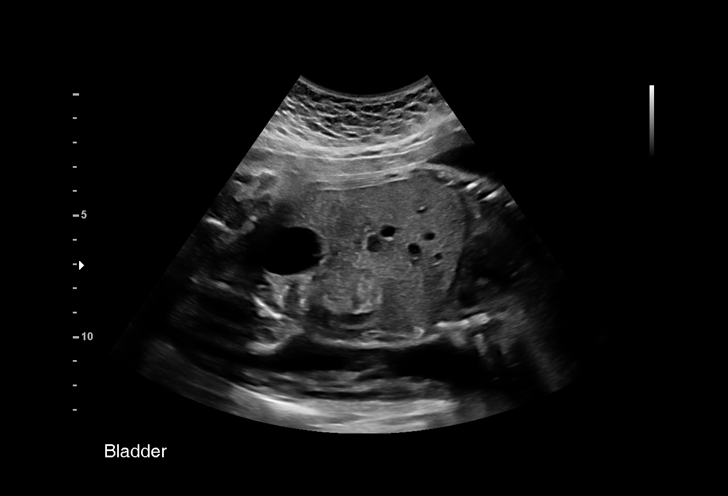
[im 39/70]
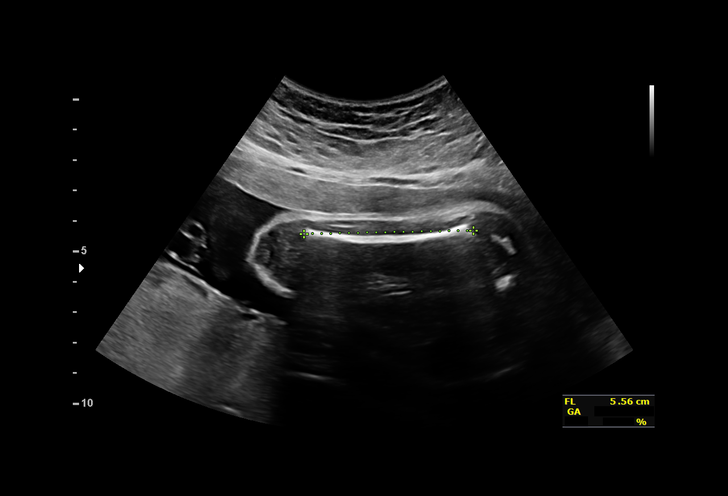
[im 44/70]
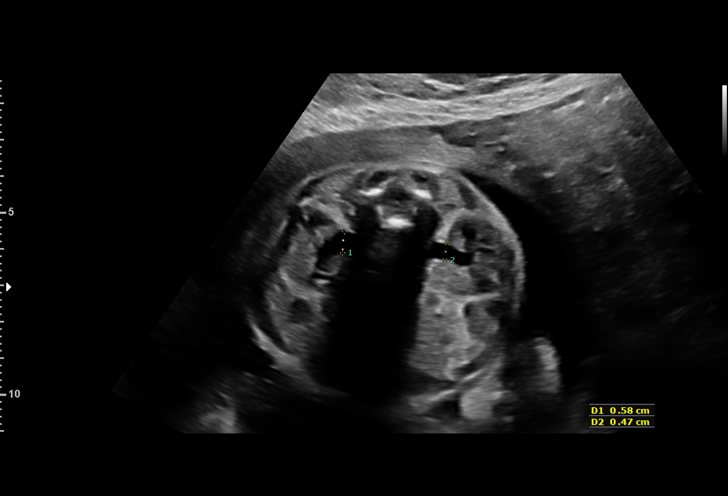
[im 49/70]
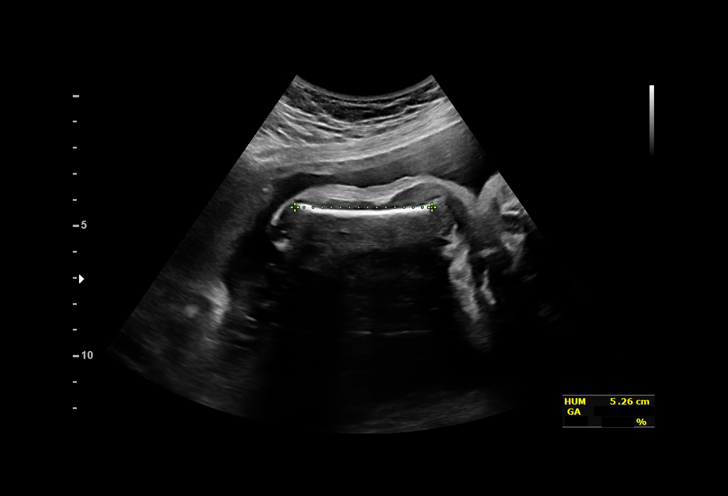
[im 54/70]
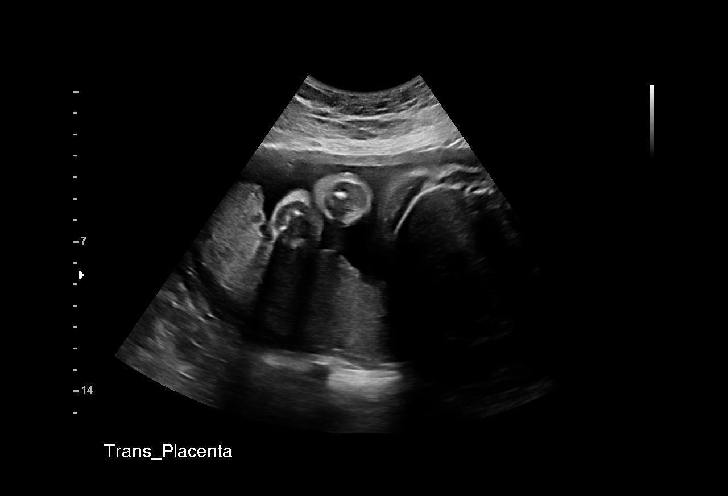
[im 59/70]
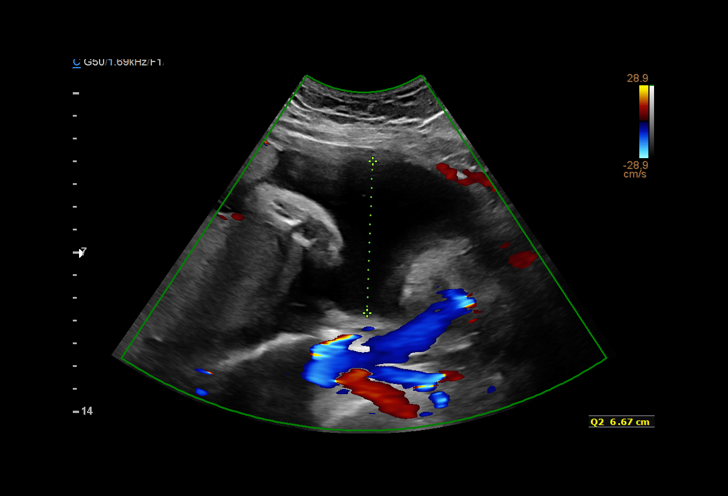
[im 64/70]
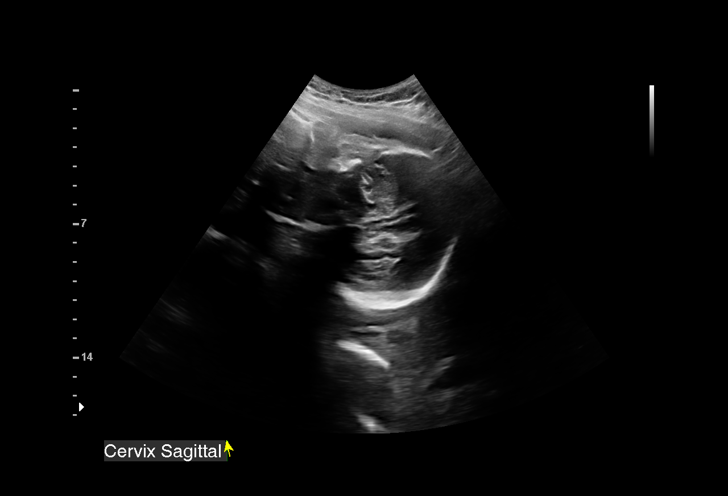
[im 70/70]
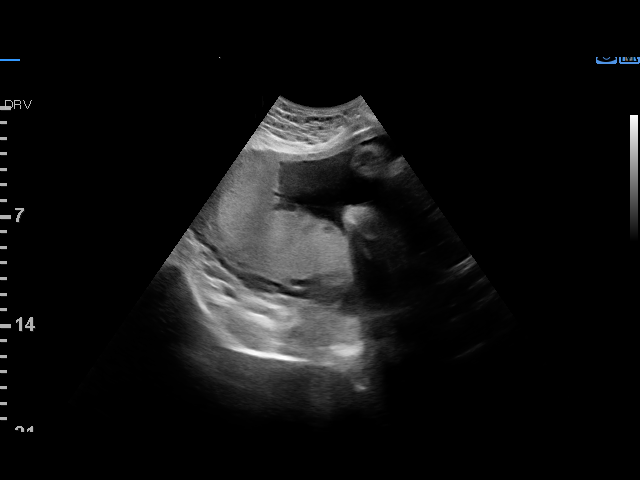

[14 of 28 positions shown; findings below may reference images not displayed]

[REDACTED]

1  KIRI JIM            [id number]      [id number]     [id number]
Indications

28 weeks gestation of pregnancy
Encounter for other antenatal screening
follow-up
Previous cervical surgery (LEEP)
Obesity complicating pregnancy, second
trimester
Uterine fibroids affecting pregnancy in        O34.12,
second trimester, antepartum
Pyelectasis of fetus on prenatal ultrasound
OB History

Blood Type:            Height:  5'7"   Weight (lb):  227      BMI:
Gravidity:    1         Term:   0        Prem:   0        SAB:   0
TOP:          0       Ectopic:  0        Living: 0
Fetal Evaluation

Num Of Fetuses:     1
Fetal Heart         130
Rate(bpm):
Cardiac Activity:   Observed
Presentation:       Cephalic
Placenta:           Posterior, above cervical os

Amniotic Fluid
AFI FV:      Subjectively within normal limits
AFI Sum(cm)     %Tile       Largest Pocket(cm)
21.71           88

RUQ(cm)       RLQ(cm)       LUQ(cm)        LLQ(cm)
5.52
Biometry

BPD:      81.7  mm     G. Age:  32w 6d       > 99  %    CI:        76.89   %   70 - 86
FL/HC:      18.9   %   18.8 -
HC:      295.1  mm     G. Age:  32w 4d       > 97  %    HC/AC:      1.11       1.05 -
AC:      265.5  mm     G. Age:  30w 5d         94  %    FL/BPD:     68.4   %   71 - 87
FL:       55.9  mm     G. Age:  29w 3d         64  %    FL/AC:      21.1   %   20 - 24
HUM:      51.5  mm     G. Age:  30w 1d         80  %

Est. FW:    3769  gm      3 lb 9 oz     83  %
Gestational Age

LMP:           28w 3d       Date:   12/17/15                 EDD:   09/22/16
U/S Today:     31w 3d                                        EDD:   09/01/16
Best:          28w 3d    Det. By:   LMP  (12/17/15)          EDD:   09/22/16
Anatomy

Cranium:               Appears normal         Aortic Arch:            Previously seen
Cavum:                 Appears normal         Ductal Arch:            Previously seen
Ventricles:            Appears normal         Diaphragm:              Appears normal
Choroid Plexus:        Appears normal         Stomach:                Appears normal, left
sided
Cerebellum:            Previously seen        Abdomen:                Appears normal
Posterior Fossa:       Previously seen        Abdominal Wall:         Previously seen
Nuchal Fold:           Previously seen        Cord Vessels:           Previously seen
Face:                  Orbits and profile     Kidneys:                Appear normal
previously seen
Lips:                  Previously seen        Bladder:                Appears normal
Thoracic:              Appears normal         Spine:                  Previously seen
Heart:                 Appears normal         Upper Extremities:      Previously seen
(4CH, axis, and
situs)
RVOT:                  Appears normal         Lower Extremities:      Previously seen
LVOT:                  Appears normal

Other:  Heels and 5th digit previously visualized. Nasal bone previously
visualized.
Cervix Uterus Adnexa

Cervix
Length:           3.14  cm.
Normal appearance by transabdominal scan.

Uterus
No abnormality visualized. Fibroid previously seen, not seen on today's
exam

Left Ovary
Not visualized.

Right Ovary
Not visualized.
Impression

SIUP at 28+3 weeks
Normal interval anatomy; anatomic survey complete; kidneys
normal today
Normal amniotic fluid volume
Appropriate interval growth with EFW at the 83rd %tile
Recommendations

Follow-up as clinically indicated

## 2018-04-05 ENCOUNTER — Other Ambulatory Visit: Payer: Self-pay | Admitting: Family Medicine

## 2018-05-10 ENCOUNTER — Other Ambulatory Visit: Payer: Self-pay | Admitting: Family Medicine

## 2018-05-14 ENCOUNTER — Ambulatory Visit (INDEPENDENT_AMBULATORY_CARE_PROVIDER_SITE_OTHER): Payer: PRIVATE HEALTH INSURANCE | Admitting: Family Medicine

## 2018-05-14 ENCOUNTER — Encounter: Payer: Self-pay | Admitting: Family Medicine

## 2018-05-14 VITALS — BP 123/87 | HR 86 | Ht 67.0 in | Wt 234.0 lb

## 2018-05-14 DIAGNOSIS — G43109 Migraine with aura, not intractable, without status migrainosus: Secondary | ICD-10-CM

## 2018-05-14 MED ORDER — PROPRANOLOL HCL 40 MG PO TABS
ORAL_TABLET | ORAL | 2 refills | Status: DC
Start: 2018-05-14 — End: 2019-04-20

## 2018-05-14 MED ORDER — ELETRIPTAN HYDROBROMIDE 40 MG PO TABS: 40.0000 mg | ORAL_TABLET | ORAL | 5 refills | Status: DC | PRN

## 2018-05-14 NOTE — Progress Notes (Signed)
  Subjective:    CC: Migraine.    HPI:  38 year old female is here today to follow-up for migraine headaches-she is having more migraines since having her last child.  She does get auras with her migraines and says more recently she will get an aura and then feel like she is starting to get a little headache and get nauseated and then it seems to stop goes away for couple hours and then usually by the afternoon she will get a full-blown migraine.  She says some days she will take Excedrin Migraine which does seem to help if she is able to catch it really early other day she just does not take anything because she understands there is a risk for rebound phenomenon so has been trying to avoid taking medication excessively.  She says her last migraine was about 3 days ago but they have been recurring about every 2 to 3 days.  She used to be on a prophylactic regimen before pregnancy but cannot quite remember what it was.  She has tried Topamax in the past but says it really was not effective and she cannot remember the name of what she used right before pregnancy.  She has used Relpax as needed for rescue and that has been effective before.  Past medical history, Surgical history, Family history not pertinant except as noted below, Social history, Allergies, and medications have been entered into the medical record, reviewed, and corrections made.   Review of Systems: No fevers, chills, night sweats, weight loss, chest pain, or shortness of breath.   Objective:    General: Well Developed, well nourished, and in no acute distress.  Neuro: Alert and oriented x3, extra-ocular muscles intact, sensation grossly intact.  HEENT: Normocephalic, atraumatic  Skin: Warm and dry, no rashes. Cardiac: Regular rate and rhythm, no murmurs rubs or gallops, no lower extremity edema.  Respiratory: Clear to auscultation bilaterally. Not using accessory muscles, speaking in full sentences.   Impression and  Recommendations:    Migraine headache with aura-we discussed options.  She feels like topiramate really was not helpful in controlling her headaches.  She is willing to try beta-blocker so we will try propranolol.  Prescription sent to pharmacy she will be losing her insurance soon so hopefully this should be cost effective because it is a generic.  Most again a refill her Relpax.  We will have to see him without insurance if it is reasonable.  Generic Imitrex might actually end up being cheaper.  Plan would have her follow-up in about 8 weeks and have her track headaches during that timeframe to see if they are less frequent and less intense.  But she will be without insurance at that point.  So would like to just have her communicate with me over my chart and then try to get in when she is able to get new health insurance.

## 2018-05-18 IMAGING — US US MFM OB FOLLOW-UP
1 series · 14 of 28 positions shown · non-contrast
Comparison: none

[Series 1: us mfm ob follow-up · 42 acquisitions, 14 frames shown]
[im 2/42]
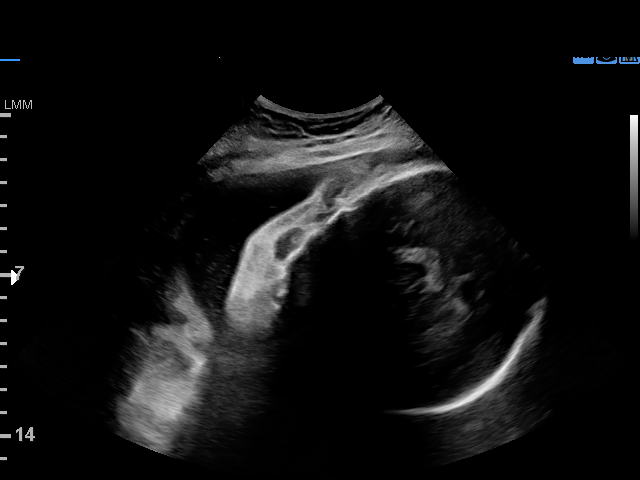
[im 5/42]
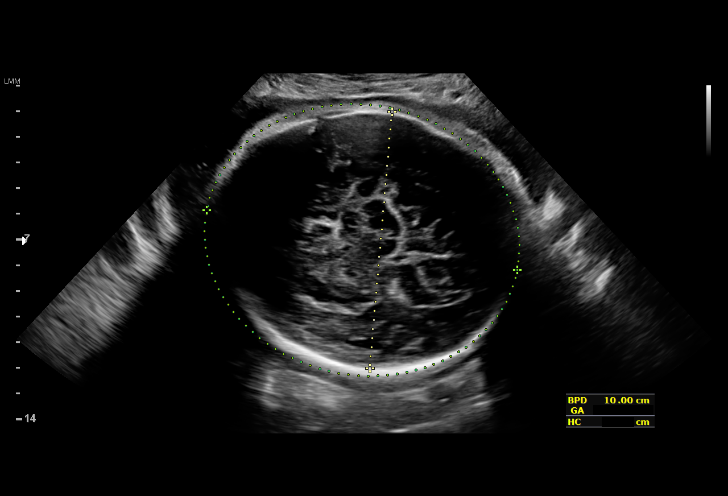
[im 8/42]
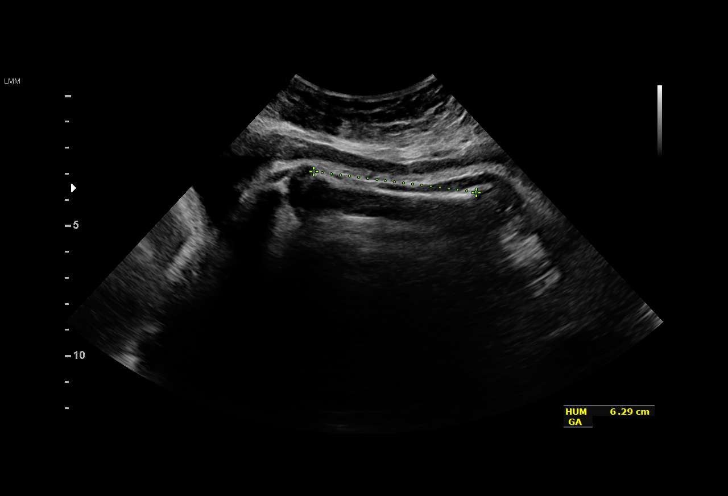
[im 11/42]
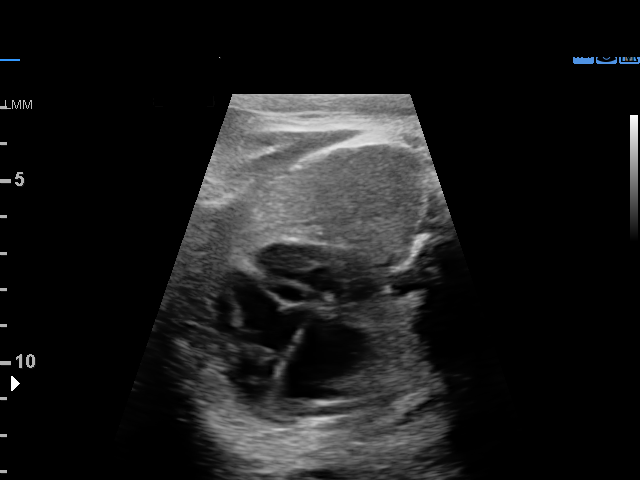
[im 14/42]
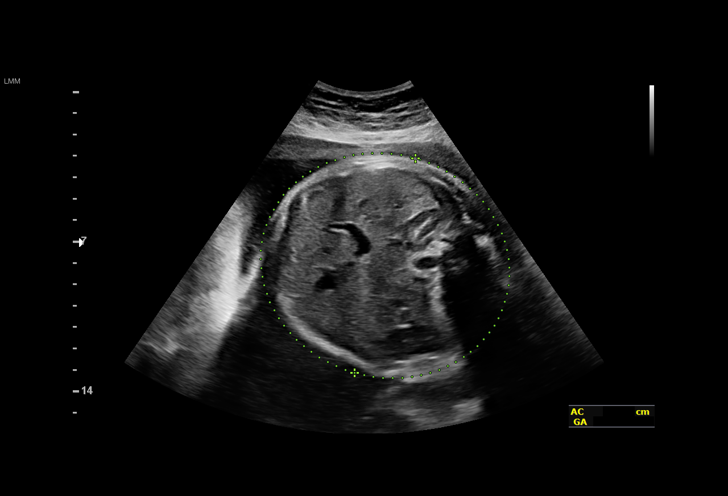
[im 17/42]
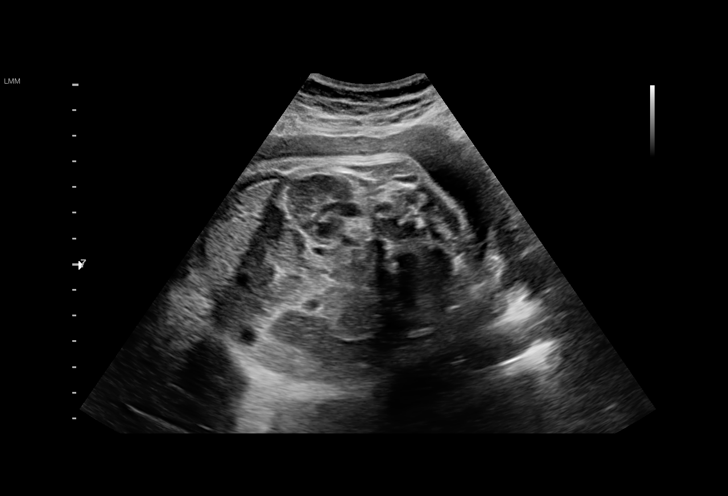
[im 20/42]
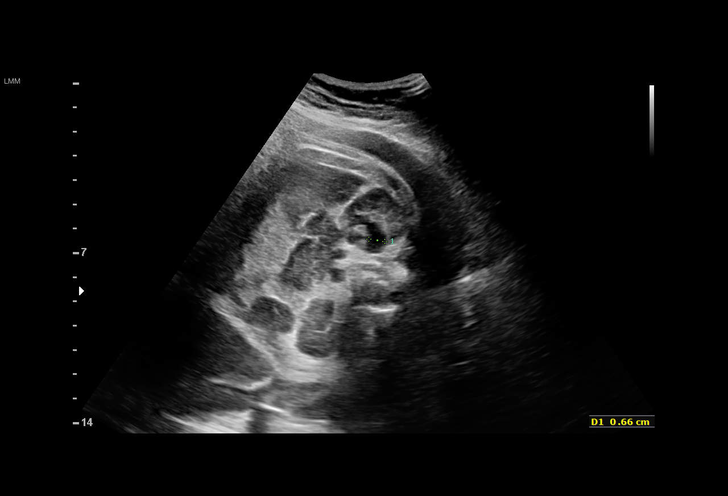
[im 23/42]
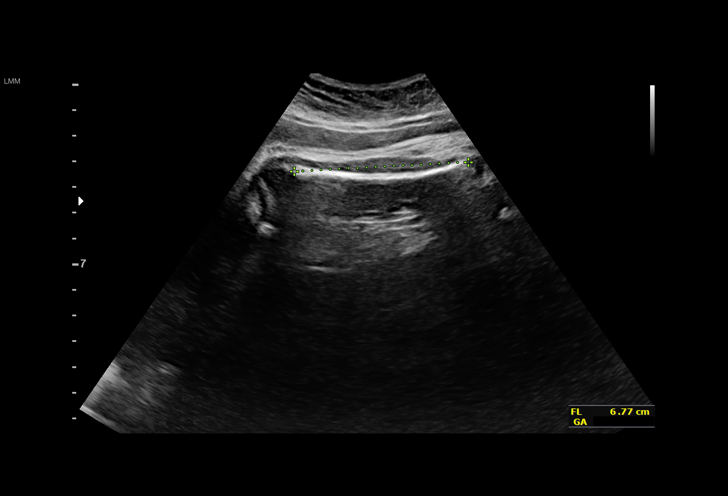
[im 26/42]
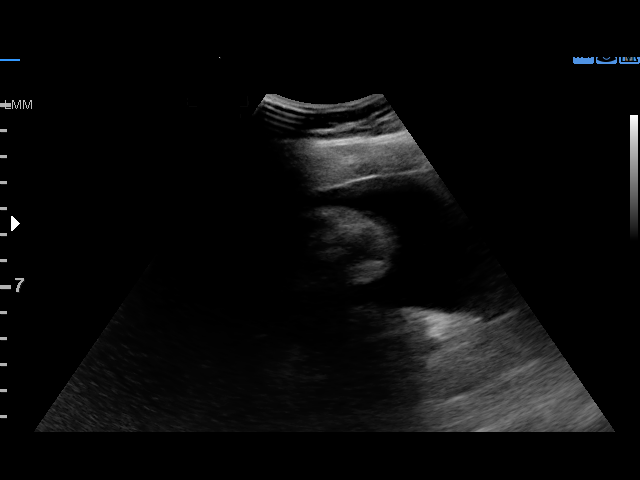
[im 29/42]
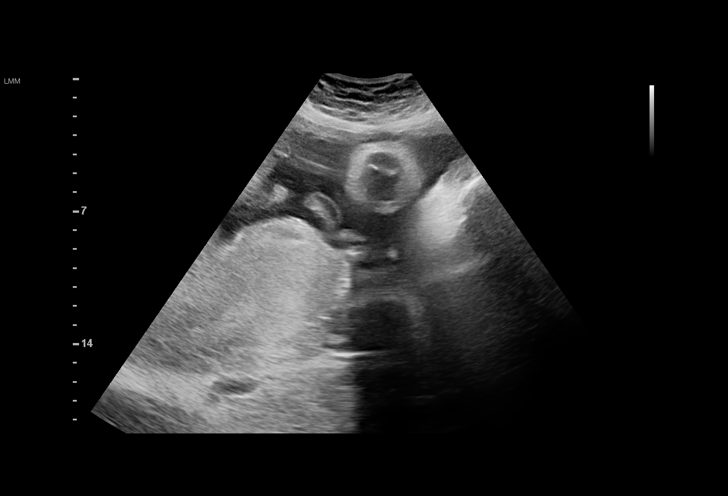
[im 32/42]
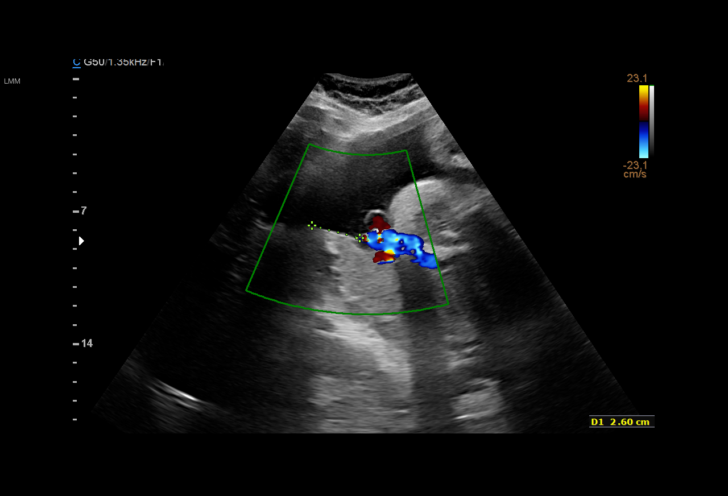
[im 35/42]
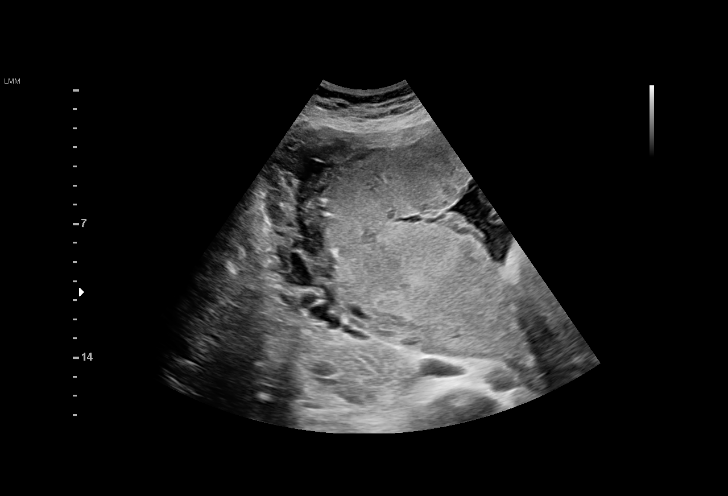
[im 38/42]
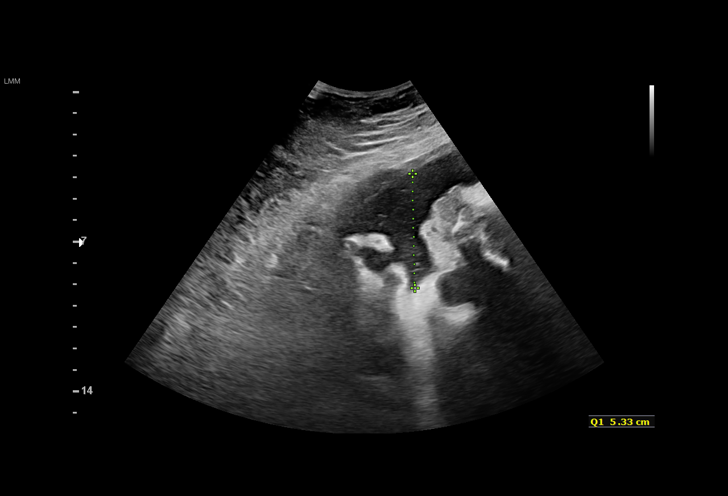
[im 42/42]
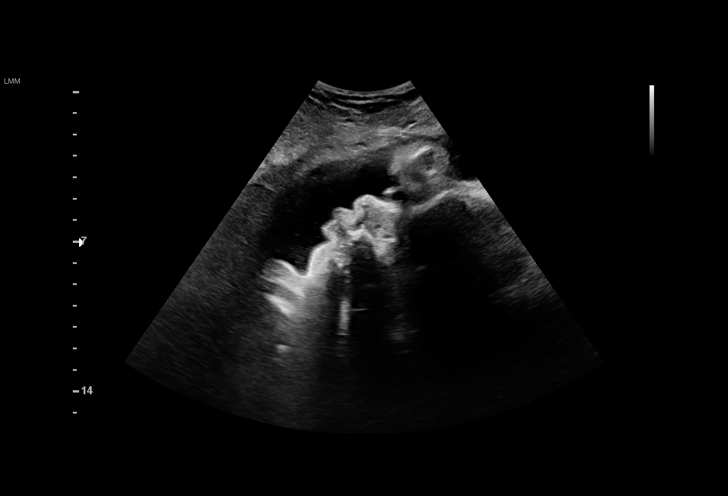

[14 of 28 positions shown; findings below may reference images not displayed]

[REDACTED]

1  TERBIZ BAKE            [id number]      [id number]     [id number]
Indications

36 weeks gestation of pregnancy
Previous cervical surgery (LEEP)
Obesity complicating pregnancy, third
trimester
Uterine fibroids affecting pregnancy in third  O34.13,
trimester, antepartum
Large for gestational age fetus affecting
management of mother
OB History

Blood Type:            Height:  5'7"   Weight (lb):  227      BMI:
Gravidity:    1         Term:   0        Prem:   0        SAB:   0
TOP:          0       Ectopic:  0        Living: 0
Fetal Evaluation

Num Of Fetuses:     1
Fetal Heart         143
Rate(bpm):
Cardiac Activity:   Observed
Presentation:       Cephalic
Placenta:           Right lateral, above cervical os

Amniotic Fluid
AFI FV:      Subjectively within normal limits

AFI Sum(cm)     %Tile       Largest Pocket(cm)
22.12           85
RUQ(cm)       RLQ(cm)       LUQ(cm)        LLQ(cm)
5.33
Biometry

BPD:      99.8  mm     G. Age:  41w 0d       > 99  %    CI:        78.93   %   70 - 86
FL/HC:      18.9   %   20.8 -
HC:      355.2  mm     G. Age:  41w 4d       > 97  %    HC/AC:      1.05       0.92 -
AC:      339.6  mm     G. Age:  37w 6d         90  %    FL/BPD:     67.2   %   71 - 87
FL:       67.1  mm     G. Age:  34w 4d          8  %    FL/AC:      19.8   %   20 - 24
HUM:      62.7  mm     G. Age:  36w 3d         63  %

Est. FW:    6646  gm      7 lb 5 oz     87  %
Gestational Age

LMP:           36w 4d       Date:   12/17/15                 EDD:   09/22/16
U/S Today:     38w 5d                                        EDD:   09/07/16
Best:          36w 4d    Det. By:   LMP  (12/17/15)          EDD:   09/22/16
Anatomy

Cranium:               Appears normal         Aortic Arch:            Previously seen
Cavum:                 Appears normal         Ductal Arch:            Previously seen
Ventricles:            Appears normal         Diaphragm:              Previously seen
Choroid Plexus:        Previously seen        Stomach:                Appears normal, left
sided
Cerebellum:            Previously seen        Abdomen:                Appears normal
Posterior Fossa:       Previously seen        Abdominal Wall:         Previously seen
Nuchal Fold:           Previously seen        Cord Vessels:           Previously seen
Face:                  Orbits and profile     Kidneys:                Appear normal
previously seen
Lips:                  Previously seen        Bladder:                Appears normal
Thoracic:              Appears normal         Spine:                  Previously seen
Heart:                 Appears normal         Upper Extremities:      Previously seen
(4CH, axis, and
situs)
RVOT:                  Previously seen        Lower Extremities:      Previously seen
LVOT:                  Previously seen

Other:  Heels and 5th digit previously visualized. Nasal bone previously
visualized. Technically difficult due to advanced GA and fetal position.
Cervix Uterus Adnexa

Cervix
Not visualized (advanced GA >83wks)

Uterus
No abnormality visualized. Previously visualized posterior fibroid not
appreciated on today's scan.
Impression

Singleton intrauterine pregnancy at 36+4 weeks with
suspected LGA fetus
Interval review of the anatomy shows no sonographic
markers for aneuploidy or structural anomalies
Amniotic fluid volume is normal with an AFI of 22cm
Estimated fetal weight is 3323g which is growth in the 87th
percentile. AC is in the 90th percentile
Recommendations

Growth at upper limit of normal. consider repeat growth
evaluation in 3 weeks if undelivered

## 2018-11-10 ENCOUNTER — Encounter: Payer: PRIVATE HEALTH INSURANCE | Admitting: Family Medicine

## 2018-11-12 ENCOUNTER — Other Ambulatory Visit: Payer: Self-pay

## 2018-11-12 ENCOUNTER — Ambulatory Visit (INDEPENDENT_AMBULATORY_CARE_PROVIDER_SITE_OTHER): Payer: BLUE CROSS/BLUE SHIELD | Admitting: Family Medicine

## 2018-11-12 ENCOUNTER — Encounter: Payer: Self-pay | Admitting: Family Medicine

## 2018-11-12 VITALS — BP 124/81 | HR 68 | Ht 67.0 in | Wt 243.0 lb

## 2018-11-12 DIAGNOSIS — R600 Localized edema: Secondary | ICD-10-CM | POA: Insufficient documentation

## 2018-11-12 DIAGNOSIS — R635 Abnormal weight gain: Secondary | ICD-10-CM | POA: Diagnosis not present

## 2018-11-12 DIAGNOSIS — Z0000 Encounter for general adult medical examination without abnormal findings: Secondary | ICD-10-CM | POA: Diagnosis not present

## 2018-11-12 MED ORDER — HYDROCHLOROTHIAZIDE 12.5 MG PO CAPS: 12.5000 mg | ORAL_CAPSULE | Freq: Every day | ORAL | 5 refills | Status: DC

## 2018-11-12 NOTE — Progress Notes (Signed)
Subjective:     Kristin [name] is a 38 y.o. female and is here for a comprehensive physical exam. The patient reports problems - weight gain.   She is very concerned about weight gain.  She would really like to get back down to 200 pounds if at all possible.  She says even just during [place] she has gained about 8 to 10 pounds she just been really struggling.  In the past the only thing she is had significant success with was calorie counting and had used my fitness pal previously.  More recently she has cut out sugary beverages.  She has tried to exercise more.  She started tracking her exercise and realize she was only getting about 3000 steps per day and has tried to really bump that up to 10,000 steps per day.  She is trying to also cut back on portion sizes but does not feel like she is making a lot of headway.  Most of her weight goes to her thighs.  He also feels like she has been swelling more.  She said she had a lot of swelling during pregnancy but more recently she has been noticing some swelling around the ankles.  She feels like it runs in her family and she has a couple family members who have problems with lymphedema as well.  Social History   Socioeconomic History  . Marital status: Married    Spouse name: Roderic Palau  . Number of children: 4  . Years of education: Not on file  . Highest education level: Bachelor's degree (e.g., BA, AB, BS)  Occupational History  . Occupation: HOmemaker  Social Needs  . Financial resource strain: Not on file  . Food insecurity    Worry: Not on file    Inability: Not on file  . Transportation needs    Medical: Not on file    Non-medical: Not on file  Tobacco Use  . Smoking status: Former Smoker    Packs/day: 0.50    Types: Cigarettes    Quit date: 12/16/2015    Years since quitting: 2.9  . Smokeless tobacco: Never Used  Substance and Sexual Activity  . Alcohol use: No    Alcohol/week: 7.0 - 10.0 standard drinks    Types: 7 - 10 Standard  drinks or equivalent per week    Comment: " a few drinks a night"  . Drug use: No  . Sexual activity: Yes    Partners: Male    Birth control/protection: None, I.U.D.  Lifestyle  . Physical activity    Days per week: Not on file    Minutes per session: Not on file  . Stress: Not on file  Relationships  . Social Herbalist on phone: Not on file    Gets together: Not on file    Attends religious service: Not on file    Active member of club or organization: Not on file    Attends meetings of clubs or organizations: Not on file    Relationship status: Not on file  . Intimate partner violence    Fear of current or ex partner: Not on file    Emotionally abused: Not on file    Physically abused: Not on file    Forced sexual activity: Not on file  Other Topics Concern  . Not on file  Social History Narrative   Married to [employer].  Children Westley Chandler, Judson Roch and [place] lives with her.   Health Maintenance  Topic Date Due  . PAP SMEAR-Modifier  05/29/2018  . INFLUENZA VACCINE  11/15/2018  . TETANUS/TDAP  07/07/2026  . HIV Screening  Completed    The following portions of the patient's history were reviewed and updated as appropriate: allergies, current medications, past family history, past medical history, past social history, past surgical history and problem list.  Review of Systems A comprehensive review of systems was negative.   Objective:    BP (!) 127/101   Pulse 84   Ht 5\' 7"  (1.702 m)   Wt 243 lb (110.2 kg)   BMI 38.06 kg/m  General appearance: alert, cooperative and appears stated age Head: Normocephalic, without obvious abnormality, atraumatic Eyes: conj clear, EOMI, PEEERLA Ears: normal TM's and external ear canals both ears Nose: Nares normal. Septum midline. Mucosa normal. No drainage or sinus tenderness. Throat: lips, mucosa, and tongue normal; teeth and gums normal Neck: no adenopathy, no carotid bruit, no JVD, supple,  symmetrical, trachea midline and thyroid not enlarged, symmetric, no tenderness/mass/nodules Back: symmetric, no curvature. ROM normal. No CVA tenderness. Lungs: clear to auscultation bilaterally Heart: regular rate and rhythm, S1, S2 normal, no murmur, click, rub or gallop Abdomen: soft, non-tender; bowel sounds normal; no masses,  no organomegaly Extremities: extremities normal, atraumatic, no cyanosis or edema Pulses: 2+ and symmetric Skin: Skin color, texture, turgor normal. No rashes or lesions Lymph nodes: Cervical, supraclavicular, and axillary nodes normal. Neurologic: Alert and oriented X 3, normal strength and tone. Normal symmetric reflexes. Normal coordination and gait    Assessment:    Healthy female exam.      Plan:     See After Visit Summary for Counseling Recommendations   Keep up a regular exercise program and make sure you are eating a healthy diet Try to eat 4 servings of dairy a day, or if you are lactose intolerant take a calcium with vitamin D daily.  Your vaccines are up to date.   Abnormal weight gain/BMI 38-we discussed options.  She has worked on getting her exercise up so just encouraged her to stick with that really work on portion control and making more healthy choices.  At some point she may have to go back to calorie counting as that did work well for her in the past.  We also discussed that there are newer prescription medications on the market that are relatively safe and can be used for weight loss.  Write those down for her so she can check with her insurance to see if they are covered.  Lower extremity edema-discussed the importance of wearing compression stockings in addition we did decide to start hydrochlorothiazide 12.5 mg daily after much discussion.  She will try that and see if it is helpful.  Did encourage her to get back in with her OB/GYN for her Pap smear.  She was due in February for 3-year recall.  She says she will try to get that  scheduled now that she has new health insurance.

## 2018-11-12 NOTE — Patient Instructions (Signed)
Can try calling your insurance and see if they will cover some of the newer weight loss drugs such as Saxenda or Qsymia or Contrave.

## 2018-11-13 LAB — COMPLETE METABOLIC PANEL WITH GFR
AG Ratio: 1.7 (calc) (ref 1.0–2.5)
ALT: 14 U/L (ref 6–29)
AST: 13 U/L (ref 10–30)
Albumin: 4.3 g/dL (ref 3.6–5.1)
Alkaline phosphatase (APISO): 64 U/L (ref 31–125)
BUN: 11 mg/dL (ref 7–25)
CO2: 25 mmol/L (ref 20–32)
Calcium: 9.5 mg/dL (ref 8.6–10.2)
Chloride: 107 mmol/L (ref 98–110)
Creat: 0.54 mg/dL (ref 0.50–1.10)
GFR, Est African American: 140 mL/min/{1.73_m2} (ref >=60)
GFR, Est Non African American: 121 mL/min/{1.73_m2} (ref >=60)
Globulin: 2.6 g/dL (calc) (ref 1.9–3.7)
Glucose, Bld: 92 mg/dL (ref 65–139)
Potassium: 4.5 mmol/L (ref 3.5–5.3)
Sodium: 139 mmol/L (ref 135–146)
Total Bilirubin: 0.4 mg/dL (ref 0.2–1.2)
Total Protein: 6.9 g/dL (ref 6.1–8.1)

## 2018-11-13 LAB — CBC
HCT: 38.4 % (ref 35.0–45.0)
Hemoglobin: 12.7 g/dL (ref 11.7–15.5)
MCH: 29.1 pg (ref 27.0–33.0)
MCHC: 33.1 g/dL (ref 32.0–36.0)
MCV: 88.1 fL (ref 80.0–100.0)
MPV: 9.6 fL (ref 7.5–12.5)
Platelets: 381 10*3/uL (ref 140–400)
RBC: 4.36 10*6/uL (ref 3.80–5.10)
RDW: 12.3 % (ref 11.0–15.0)
WBC: 8.9 10*3/uL (ref 3.8–10.8)

## 2018-11-13 LAB — LIPID PANEL
Cholesterol: 158 mg/dL (ref <200)
HDL: 62 mg/dL (ref >=50)
LDL Cholesterol (Calc): 81 mg/dL (calc)
Non-HDL Cholesterol (Calc): 96 mg/dL (calc) (ref <130)
Total CHOL/HDL Ratio: 2.5 (calc) (ref <5.0)
Triglycerides: 73 mg/dL (ref <150)

## 2018-12-02 ENCOUNTER — Other Ambulatory Visit: Payer: Self-pay | Admitting: Family Medicine

## 2019-01-02 ENCOUNTER — Other Ambulatory Visit: Payer: Self-pay

## 2019-01-02 MED ORDER — OMEPRAZOLE 40 MG PO CPDR: 40.0000 mg | DELAYED_RELEASE_CAPSULE | Freq: Every day | ORAL | 1 refills | Status: DC

## 2019-01-07 ENCOUNTER — Telehealth: Payer: Self-pay

## 2019-01-07 ENCOUNTER — Telehealth: Payer: Self-pay | Admitting: Family Medicine

## 2019-01-07 NOTE — Telephone Encounter (Signed)
Information has been sent to insurance and waiting on a response.   

## 2019-01-07 NOTE — Telephone Encounter (Signed)
Kristin [name] needs a PA for Omeprazole. She takes the medication daily and will need a daily dose exception for the insurance.

## 2019-01-07 NOTE — Telephone Encounter (Signed)
Opened in error

## 2019-01-07 NOTE — Telephone Encounter (Signed)
[facility] received a request from your prescriber for coverage of Omeprazole 40MG  OR CPDR for you. As long as you remain covered by your prescription drug plan and there are no changes to your plan benefits, this request is approved for the following time period: 01/07/2019 - 01/06/2022.  Pharmacy aware.

## 2019-04-20 ENCOUNTER — Ambulatory Visit (INDEPENDENT_AMBULATORY_CARE_PROVIDER_SITE_OTHER): Payer: BLUE CROSS/BLUE SHIELD | Admitting: Family Medicine

## 2019-04-20 ENCOUNTER — Other Ambulatory Visit: Payer: Self-pay

## 2019-04-20 ENCOUNTER — Other Ambulatory Visit: Payer: Self-pay | Admitting: Family Medicine

## 2019-04-20 ENCOUNTER — Encounter: Payer: Self-pay | Admitting: Family Medicine

## 2019-04-20 VITALS — Temp 98.0°F

## 2019-04-20 DIAGNOSIS — Z23 Encounter for immunization: Secondary | ICD-10-CM

## 2019-04-20 DIAGNOSIS — R600 Localized edema: Secondary | ICD-10-CM

## 2019-04-20 MED ORDER — HYDROCHLOROTHIAZIDE 12.5 MG PO CAPS: 12.5000 mg | ORAL_CAPSULE | Freq: Every day | ORAL | 1 refills | Status: DC

## 2019-04-20 NOTE — Progress Notes (Signed)
Needs 90 days supply on the hctz

## 2019-04-20 NOTE — Addendum Note (Signed)
Addended by: Beatrice Lecher D on: 04/20/2019 08:11 PM   Modules accepted: Level of Service

## 2019-04-20 NOTE — Progress Notes (Signed)
Refilled diuretic today but needs to schedule f/u appt soon.   Kristin Lecher, MD

## 2019-04-20 NOTE — Progress Notes (Signed)
Pt here for flu shot. Afebrile,no recent illness. Vaccination given, pt tolerated well.Maryruth Eve, Lahoma Crocker, CMA

## 2019-06-29 ENCOUNTER — Other Ambulatory Visit: Payer: Self-pay | Admitting: Family Medicine

## 2019-12-15 ENCOUNTER — Ambulatory Visit (INDEPENDENT_AMBULATORY_CARE_PROVIDER_SITE_OTHER): Payer: BLUE CROSS/BLUE SHIELD | Admitting: Physician Assistant

## 2019-12-15 ENCOUNTER — Encounter: Payer: Self-pay | Admitting: Physician Assistant

## 2019-12-15 ENCOUNTER — Ambulatory Visit (INDEPENDENT_AMBULATORY_CARE_PROVIDER_SITE_OTHER): Payer: BLUE CROSS/BLUE SHIELD

## 2019-12-15 ENCOUNTER — Other Ambulatory Visit: Payer: Self-pay

## 2019-12-15 ENCOUNTER — Other Ambulatory Visit: Payer: Self-pay | Admitting: Physician Assistant

## 2019-12-15 VITALS — BP 130/81 | HR 71 | Ht 67.0 in | Wt 237.0 lb

## 2019-12-15 DIAGNOSIS — R112 Nausea with vomiting, unspecified: Secondary | ICD-10-CM

## 2019-12-15 DIAGNOSIS — R1010 Upper abdominal pain, unspecified: Secondary | ICD-10-CM | POA: Diagnosis not present

## 2019-12-15 DIAGNOSIS — K769 Liver disease, unspecified: Secondary | ICD-10-CM | POA: Insufficient documentation

## 2019-12-15 DIAGNOSIS — R1011 Right upper quadrant pain: Secondary | ICD-10-CM

## 2019-12-15 DIAGNOSIS — K8020 Calculus of gallbladder without cholecystitis without obstruction: Secondary | ICD-10-CM | POA: Insufficient documentation

## 2019-12-15 DIAGNOSIS — K219 Gastro-esophageal reflux disease without esophagitis: Secondary | ICD-10-CM | POA: Diagnosis not present

## 2019-12-15 DIAGNOSIS — K828 Other specified diseases of gallbladder: Secondary | ICD-10-CM | POA: Insufficient documentation

## 2019-12-15 MED ORDER — ONDANSETRON 8 MG PO TBDP: 8.0000 mg | ORAL_TABLET | Freq: Three times a day (TID) | ORAL | 1 refills | Status: DC | PRN

## 2019-12-15 NOTE — Progress Notes (Signed)
Subjective:    Patient ID: Kristin [name], female    DOB: 1980-11-18, 39 y.o.   MRN: [id number]  HPI  Patient is a 39 year old obese female with history of GERD and on omeprazole who presents to the clinic with 1 week of right upper quadrant pain, nausea, vomiting.  About 1 week ago her right upper quadrant pain started with nausea and vomiting.  She has been compliant with her omeprazole daily.  She does not really remember what she was eating when it occurred.  She did take out fatty, greasy foods and her right upper quadrant pain has gotten a little bit better.  She still has some throbbing pain in her right upper quadrant.  She denies any current nausea or vomiting.  She denies any diarrhea but she has had more constipation recently.  Her father and brother both have gallbladder issues and had to have surgery for removal.  Patient denies any fever, chills, body aches, dysuria.  .. Active Ambulatory Problems    Diagnosis Date Noted  . History of loop electrical excision procedure (LEEP) 05/15/2016  . Hemorrhoids 08/20/2016  . Lower extremity edema 11/12/2018  . Gastroesophageal reflux disease 12/15/2019   Resolved Ambulatory Problems    Diagnosis Date Noted  . Encounter for supervision of normal first pregnancy in first trimester 02/20/2016  . Obesity in pregnancy 02/20/2016  . Migraines 04/17/2016  . LGA (large for gestational age) fetus affecting management of mother 09/03/2016  . Dependent edema 09/03/2016  . PROM (premature rupture of membranes) 09/21/2016   Past Medical History:  Diagnosis Date  . Abnormal Pap smear of cervix 2008  . Arthritis   . Depression   . History of dysmenorrhea   . Migraine   . Ulcer   . Vein, varicose     Review of Systems    see HPI.  Objective:   Physical Exam Vitals reviewed.  Constitutional:      Appearance: She is well-developed. She is obese.  Cardiovascular:     Rate and Rhythm: Normal rate and regular rhythm.  Abdominal:      Palpations: Abdomen is soft.     Tenderness: There is abdominal tenderness in the right upper quadrant. There is no right CVA tenderness, left CVA tenderness, guarding or rebound. Negative signs include Murphy's sign.  Neurological:     General: No focal deficit present.     Mental Status: She is alert and oriented to person, place, and time.  Psychiatric:        Mood and Affect: Mood normal.           Assessment & Plan:  Marland KitchenMarland KitchenLilliona was seen today for abdominal pain.  Diagnoses and all orders for this visit:  Right upper quadrant pain -     CBC with Differential/Platelet -     Lipase -     COMPLETE METABOLIC PANEL WITH GFR -     US Abdomen Complete  Nausea and vomiting, intractability of vomiting not specified, unspecified vomiting type -     CBC with Differential/Platelet -     Lipase -     COMPLETE METABOLIC PANEL WITH GFR -     ondansetron (ZOFRAN-ODT) 8 MG disintegrating tablet; Take 1 tablet (8 mg total) by mouth every 8 (eight) hours as needed for nausea or vomiting. -     US Abdomen Complete  Upper abdominal pain -     CBC with Differential/Platelet -     Lipase -     COMPLETE  METABOLIC PANEL WITH GFR -     US Abdomen Complete  Gastroesophageal reflux disease, unspecified whether esophagitis present   Patient symptoms seem fairly consistent with gallbladder disease.  Likely not acute gallbladder disease.  She has made a lot of diet changes this week which is made her right upper quadrant pain better.  Continue on omeprazole.  We will get ultrasound today as well as CBC, lipase, CMP.  Discussed foods that can trigger gallbladder attacks.  Once we get ultrasound results we can let her know better the next plan of action. zofran sent for as needed nausea/vomiting.

## 2019-12-15 NOTE — Progress Notes (Signed)
Janis,   You do have mild gallbladder thickening with gallstones. Not acute but will send to general surgery.   You do have hepatic lesion that is likely a benign hemangioma. You don't have any history of cancer correct. Follow up u/s in 6 months.

## 2019-12-16 LAB — COMPLETE METABOLIC PANEL WITH GFR
AG Ratio: 1.6 (calc) (ref 1.0–2.5)
ALT: 13 U/L (ref 6–29)
AST: 13 U/L (ref 10–30)
Albumin: 4.2 g/dL (ref 3.6–5.1)
Alkaline phosphatase (APISO): 71 U/L (ref 31–125)
BUN: 11 mg/dL (ref 7–25)
CO2: 25 mmol/L (ref 20–32)
Calcium: 9.3 mg/dL (ref 8.6–10.2)
Chloride: 106 mmol/L (ref 98–110)
Creat: 0.58 mg/dL (ref 0.50–1.10)
GFR, Est African American: 136 mL/min/{1.73_m2} (ref >=60)
GFR, Est Non African American: 117 mL/min/{1.73_m2} (ref >=60)
Globulin: 2.6 g/dL (calc) (ref 1.9–3.7)
Glucose, Bld: 98 mg/dL (ref 65–139)
Potassium: 4.2 mmol/L (ref 3.5–5.3)
Sodium: 138 mmol/L (ref 135–146)
Total Bilirubin: 0.4 mg/dL (ref 0.2–1.2)
Total Protein: 6.8 g/dL (ref 6.1–8.1)

## 2019-12-16 LAB — LIPASE: Lipase: 13 U/L (ref 7–60)

## 2019-12-16 LAB — CBC WITH DIFFERENTIAL/PLATELET
Absolute Monocytes: 432 cells/uL (ref 200–950)
Basophils Absolute: 43 cells/uL (ref 0–200)
Basophils Relative: 0.6 %
Eosinophils Absolute: 230 cells/uL (ref 15–500)
Eosinophils Relative: 3.2 %
HCT: 39.9 % (ref 35.0–45.0)
Hemoglobin: 13.4 g/dL (ref 11.7–15.5)
Lymphs Abs: 2174 cells/uL (ref 850–3900)
MCH: 29.5 pg (ref 27.0–33.0)
MCHC: 33.6 g/dL (ref 32.0–36.0)
MCV: 87.7 fL (ref 80.0–100.0)
MPV: 9.6 fL (ref 7.5–12.5)
Monocytes Relative: 6 %
Neutro Abs: 4320 cells/uL (ref 1500–7800)
Neutrophils Relative %: 60 %
Platelets: 353 10*3/uL (ref 140–400)
RBC: 4.55 10*6/uL (ref 3.80–5.10)
RDW: 12.8 % (ref 11.0–15.0)
Total Lymphocyte: 30.2 %
WBC: 7.2 10*3/uL (ref 3.8–10.8)

## 2019-12-16 NOTE — Progress Notes (Signed)
No alarming features of labs. You should be called soon to make appt with general surgery.

## 2020-01-02 ENCOUNTER — Other Ambulatory Visit: Payer: Self-pay | Admitting: Family Medicine

## 2020-03-18 ENCOUNTER — Ambulatory Visit: Payer: BLUE CROSS/BLUE SHIELD | Attending: Internal Medicine

## 2020-03-18 ENCOUNTER — Other Ambulatory Visit (HOSPITAL_BASED_OUTPATIENT_CLINIC_OR_DEPARTMENT_OTHER): Payer: Self-pay | Admitting: Internal Medicine

## 2020-03-18 DIAGNOSIS — Z23 Encounter for immunization: Secondary | ICD-10-CM

## 2020-03-18 NOTE — Progress Notes (Signed)
   Covid-19 Vaccination Clinic  Name:  STACY SAILER    MRN: [id number] DOB: 1981-02-08  03/18/2020  Ms. Inclan was observed post Covid-19 immunization for 15 minutes without incident. She was provided with Vaccine Information Sheet and instruction to access the V-Safe system.   Ms. Don was instructed to call 911 with any severe reactions post vaccine: Marland Kitchen Difficulty breathing  . Swelling of face and throat  . A fast heartbeat  . A bad rash all over body  . Dizziness and weakness   Immunizations Administered    Name Date Dose VIS Date Route   JANSSEN COVID-19 VACCINE 03/18/2020 10:29 AM 0.5 mL 02/03/2020 Intramuscular   Manufacturer: [employer]   Lot: [id number]   [place]: [id number]

## 2020-03-22 MED FILL — JANSSEN COVID-19 VACCINE 0.: 0.5 | 1 days supply | Qty: 1 | Fill #0

## 2020-07-14 ENCOUNTER — Other Ambulatory Visit: Payer: Self-pay | Admitting: Family Medicine

## 2020-07-14 ENCOUNTER — Other Ambulatory Visit: Payer: Self-pay | Admitting: *Deleted

## 2020-07-20 ENCOUNTER — Other Ambulatory Visit: Payer: Self-pay | Admitting: *Deleted

## 2020-07-20 MED ORDER — PANTOPRAZOLE SODIUM 40 MG PO TBEC: 40.0000 mg | DELAYED_RELEASE_TABLET | Freq: Every day | ORAL | 3 refills | Status: DC

## 2020-07-20 NOTE — Progress Notes (Signed)
Pt called and said that the pharmacy will not fill the omeprazole. Pt advised that we received an alternative for her because omeprazole wasn't covered and they suggested esomeprazole. This was also not covered. Will send for pantoprazole

## 2020-11-01 ENCOUNTER — Ambulatory Visit (INDEPENDENT_AMBULATORY_CARE_PROVIDER_SITE_OTHER): Payer: 59 | Admitting: Medical-Surgical

## 2020-11-01 ENCOUNTER — Encounter: Payer: Self-pay | Admitting: Medical-Surgical

## 2020-11-01 ENCOUNTER — Other Ambulatory Visit: Payer: Self-pay

## 2020-11-01 VITALS — BP 135/83 | HR 85 | Temp 98.7°F | Ht 66.5 in | Wt 257.6 lb

## 2020-11-01 DIAGNOSIS — Z1329 Encounter for screening for other suspected endocrine disorder: Secondary | ICD-10-CM

## 2020-11-01 DIAGNOSIS — Z136 Encounter for screening for cardiovascular disorders: Secondary | ICD-10-CM

## 2020-11-01 DIAGNOSIS — K8020 Calculus of gallbladder without cholecystitis without obstruction: Secondary | ICD-10-CM | POA: Diagnosis not present

## 2020-11-01 DIAGNOSIS — H6983 Other specified disorders of Eustachian tube, bilateral: Secondary | ICD-10-CM

## 2020-11-01 DIAGNOSIS — H0015 Chalazion left lower eyelid: Secondary | ICD-10-CM | POA: Diagnosis not present

## 2020-11-01 DIAGNOSIS — Z0000 Encounter for general adult medical examination without abnormal findings: Secondary | ICD-10-CM

## 2020-11-01 DIAGNOSIS — R42 Dizziness and giddiness: Secondary | ICD-10-CM

## 2020-11-01 DIAGNOSIS — Z1159 Encounter for screening for other viral diseases: Secondary | ICD-10-CM

## 2020-11-01 DIAGNOSIS — Z13228 Encounter for screening for other metabolic disorders: Secondary | ICD-10-CM

## 2020-11-01 DIAGNOSIS — Z13220 Encounter for screening for lipoid disorders: Secondary | ICD-10-CM

## 2020-11-01 MED ORDER — MECLIZINE HCL 25 MG PO TABS: 25.0000 mg | ORAL_TABLET | Freq: Three times a day (TID) | ORAL | 3 refills | Status: DC | PRN

## 2020-11-01 MED ORDER — METFORMIN HCL ER 500 MG PO TB24: 500.0000 mg | ORAL_TABLET | Freq: Every day | ORAL | 0 refills | Status: DC

## 2020-11-01 NOTE — Progress Notes (Signed)
HPI with pertinent ROS:   CC: Several complaints  HPI: Pleasant 40 year old female originally presenting for her annual physical exam but decided to make this a problem visit as she has multiple concerns.  Notes that last week she had what they thought was strep.  She did do an E-visit was prescribed amoxicillin 3 times daily x5 days.  She completed the antibiotics with out complications.  Notes that her ears are still bothering her and that she is able to feel fluid move.  She is having ringing in her ear as well as dizziness.  Notes that the dizziness originally started a month and a half ago but has been made worse by the symptoms.  When she blows her nose, she does have clear discharge but if she sniffs back and spits the secretions out there is what seems to be bloody tissue.  No fevers, chills, or other respiratory symptoms.  She previously was referred to general surgery for evaluation of gallstones and possible interventions.  Unfortunately, her insurance was awful and she did not go through with the referral.  She does not have any current symptoms would like to have a referral placed again for general surgery to discuss her options now that her insurance is better.    She has had a swollen area on the left lower eyelid for approximately 8 months.  She has had multiple rounds of antibiotics in the 75-month period for other issues with no improvement.  She has not seen an eye doctor for this and is in the process of finding a new one.  Weight loss-she is very concerned about the 20 pound weight gain that she is put on recently.  Notes that the 20 pounds came on in a period of approximately 2 months.  She is interested in what medications can be used to help with weight loss.  I reviewed the past medical history, family history, social history, surgical history, and allergies today and no changes were needed.  Please see the problem list section below in epic for further details.   Physical  exam:   General: Well Developed, well nourished, and in no acute distress.  Neuro: Alert and oriented x3.  HEENT: Normocephalic, atraumatic.  Bilateral external ear canals patent.  TMs patent mildly erythematous with bilateral effusions. Skin: Warm and dry. Cardiac: Regular rate and rhythm, no murmurs rubs or gallops, no lower extremity edema.  Respiratory: Clear to auscultation bilaterally. Not using accessory muscles, speaking in full sentences.  Impression and Recommendations:    1. Calculus of gallbladder without cholecystitis without obstruction Referring to general surgery per patient request. - Ambulatory referral to General Surgery  2. Vertigo Start meclizine 25 mg 3 times daily as needed.  3. Dysfunction of both eustachian tubes Start over-the-counter antihistamine such as Xyzal.  Also start nasal spray such as Flonase twice daily for the first week then reduce to once daily for maintenance.  4. Chalazion of left lower eyelid Appears to be a chalazion of the left lower eyelid.  Recommend evaluation by her eye doctor for discussion of possible treatments.  5.  Morbid obesity Blood pressure borderline today and her insurance will likely not cover any other weight loss medications.  Reviewed off label options and she would like to start metformin so sending in 500 mg XR today.  Recommend taking this medication daily monitoring for GI side effects.  She will need to follow-up in approximately 4 weeks.  Return in about 4 weeks (around 11/29/2020) for annual  physical exam with pap smear. ___________________________________________ Kristin Sorrel, DNP, APRN, FNP-BC [facility] [place]

## 2020-11-23 ENCOUNTER — Other Ambulatory Visit: Payer: Self-pay | Admitting: Medical-Surgical

## 2020-12-08 ENCOUNTER — Ambulatory Visit (INDEPENDENT_AMBULATORY_CARE_PROVIDER_SITE_OTHER): Payer: 59 | Admitting: Medical-Surgical

## 2020-12-08 DIAGNOSIS — Z0000 Encounter for general adult medical examination without abnormal findings: Secondary | ICD-10-CM

## 2020-12-08 DIAGNOSIS — Z5329 Procedure and treatment not carried out because of patient's decision for other reasons: Secondary | ICD-10-CM

## 2020-12-08 NOTE — Progress Notes (Signed)
Late cancellation/no show

## 2020-12-13 ENCOUNTER — Other Ambulatory Visit: Payer: Self-pay | Admitting: Medical-Surgical

## 2020-12-23 ENCOUNTER — Other Ambulatory Visit: Payer: Self-pay | Admitting: Medical-Surgical

## 2021-01-03 ENCOUNTER — Other Ambulatory Visit (HOSPITAL_COMMUNITY)
Admission: RE | Admit: 2021-01-03 | Discharge: 2021-01-03 | Disposition: A | Discharge status: Home / Self Care | Payer: 59 | Source: Ambulatory Visit | Attending: Family Medicine | Admitting: Family Medicine

## 2021-01-03 ENCOUNTER — Ambulatory Visit (INDEPENDENT_AMBULATORY_CARE_PROVIDER_SITE_OTHER): Payer: 59 | Admitting: Family Medicine

## 2021-01-03 ENCOUNTER — Other Ambulatory Visit: Payer: Self-pay

## 2021-01-03 ENCOUNTER — Encounter: Payer: Self-pay | Admitting: Family Medicine

## 2021-01-03 VITALS — BP 114/81 | HR 98 | Temp 97.9°F | Resp 17 | Wt 253.2 lb

## 2021-01-03 DIAGNOSIS — Z124 Encounter for screening for malignant neoplasm of cervix: Secondary | ICD-10-CM

## 2021-01-03 DIAGNOSIS — Z13220 Encounter for screening for lipoid disorders: Secondary | ICD-10-CM | POA: Diagnosis not present

## 2021-01-03 DIAGNOSIS — Z0000 Encounter for general adult medical examination without abnormal findings: Secondary | ICD-10-CM | POA: Insufficient documentation

## 2021-01-03 DIAGNOSIS — Z23 Encounter for immunization: Secondary | ICD-10-CM

## 2021-01-03 NOTE — Progress Notes (Signed)
BP 114/81   Pulse 98   Temp 97.9 F (36.6 C)   Resp 17   Wt 253 lb 3.2 oz (114.9 kg)   SpO2 96%   BMI 40.26 kg/m    Subjective:    Patient ID: Kristin [name], female    DOB: 06-11-1980, 40 y.o.   MRN: [id number]  HPI: Kristin [name] is a 40 y.o. female presenting on 01/03/2021 for comprehensive medical examination. 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    She reports irregular bleeding - IUD Current menopausal symptoms: no She is currently  sexually active with  husband   She denies  concerns today about STI Contraception choices are: IUD  She denies concerns about skin changes today. She denies concerns about bowel changes today. She denies concerns about bladder changes today.   Depression Screen done today and results listed below:  [facility] 01/03/2021 11/12/2018 02/26/2017  Decreased Interest 0 0 0  Down, Depressed, Hopeless 1 0 0  PHQ - 2 Score 1 0 0  Altered sleeping '2 1 1  ' Tired, decreased energy '1 1 3  ' Change in appetite 1 0 1  Feeling bad or failure about yourself  0 0 0  Trouble concentrating 0 0 0  Moving slowly or fidgety/restless 0 0 0  Suicidal thoughts 0 0 0  PHQ-9 Score '5 2 5  ' Difficult doing work/chores Somewhat difficult Not difficult at all -   GAD 7 : Generalized Anxiety Score 01/03/2021 11/12/2018  Nervous, Anxious, on Edge 1 1  Control/stop worrying 1 1  Worry too much - different things 1 1  Trouble relaxing 0 1  Restless 0 0  Easily annoyed or irritable 1 1  Afraid - awful  might happen 0 0  Total GAD 7 Score 4 5  Anxiety Difficulty Not difficult at all Not difficult at all      She does not have a history of falls.      Past Medical History:  Past Medical History:  Diagnosis Date   Abnormal Pap smear of cervix 2008   Arthritis    Depression    migrane   History of dysmenorrhea    Migraine    Ulcer    stomach   Vein, varicose     Surgical History:  Past Surgical History:  Procedure Laterality Date   LEEP  2008   WISDOM TOOTH EXTRACTION     x3    Medications:  Current Outpatient Medications on File Prior to Visit  Medication Sig   levonorgestrel (MIRENA) 20 MCG/24HR IUD by Intrauterine route.   metFORMIN (GLUCOPHAGE-XR) 500 MG 24 hr tablet Take 1 tablet (500 mg total) by mouth daily with breakfast. **PATIENT NEEDS OFFICE VISIT FOR ADDITIONAL REFILLS** (Patient not taking: Reported on 01/03/2021)   pantoprazole (PROTONIX) 40 MG tablet Take 1 tablet (40 mg total) by mouth daily.   No current facility-administered medications on file prior to visit.    Allergies:  Allergies  Allergen Reactions   Vicodin [Hydrocodone-Acetaminophen] Nausea And Vomiting    Social History:  Social History   Socioeconomic History   Marital status: Married    Spouse name: Kristin [name]   Number of children: 4   Years of education: Not on file   Highest education level: Bachelor's degree (e.g., BA, AB, BS)  Occupational History   Occupation: HOmemaker  Tobacco Use   Smoking status: Former    Packs/day: 0.50    Types: Cigarettes    Quit date: 12/16/2015    Years since quitting: 5.0   Smokeless tobacco: Never  Substance and Sexual Activity   Alcohol use: No    Alcohol/week: 7.0 - 10.0 standard drinks    Types: 7 - 10 Standard drinks or equivalent per week    Comment: " a few drinks a night"   Drug use: No   Sexual activity: Yes    Partners: Male    Birth control/protection: None, I.U.D.  Other Topics Concern   Not on file  Social History  Narrative   Married to [employer].  Children Westley Chandler, Judson Roch and [place] lives with her.   Social Determinants of Health   Financial Resource Strain: Not on file  Food Insecurity: Not on file  Transportation Needs: Not on file  Physical Activity: Not on file  Stress: Not on file  Social Connections: Not on file  Intimate Partner Violence: Not on file   Social History   Tobacco Use  Smoking Status Former   Packs/day: 0.50   Types: Cigarettes   Quit date: 12/16/2015   Years since quitting: 5.0  Smokeless Tobacco Never   Social History   Substance and Sexual Activity  Alcohol Use No   Alcohol/week: 7.0 - 10.0 standard drinks   Types: 7 - 10 Standard drinks or equivalent per week   Comment: " a few drinks a night"    Family History:  Family History  Problem Relation Age of Onset   Breast cancer Mother 59       neg for BRCA gene   Breast cancer Maternal Grandmother        breast   Depression Maternal Grandfather    Depression Paternal Grandmother    Hyperlipidemia Paternal Grandmother    Down syndrome Paternal Uncle    Hyperlipidemia Father    Hyperlipidemia Paternal Grandfather     Past medical history, surgical history, medications, allergies, family history and social history reviewed with patient today and changes made to appropriate areas of the chart.   All ROS negative except what is listed above and in the HPI.      Objective:    BP 114/81   Pulse 98   Temp 97.9 F (36.6 C)   Resp 17   Wt 253 lb 3.2 oz (114.9 kg)   SpO2 96%   BMI 40.26 kg/m   Wt Readings from Last 3 Encounters:  01/03/21 253 lb 3.2 oz (114.9 kg)  11/01/20 257 lb 9.6 oz (116.8 kg)  12/15/19 237 lb (107.5 kg)    Physical Exam Vitals reviewed.  Constitutional:      Appearance: Normal appearance. She is obese.  HENT:     Head: Normocephalic and atraumatic.     Right Ear: Tympanic membrane normal.     Left Ear: Tympanic membrane normal.     Nose: Nose normal.      Mouth/Throat:     Mouth: Mucous membranes are moist.     Pharynx: Oropharynx is clear.  Eyes:     Extraocular Movements: Extraocular movements intact.     Conjunctiva/sclera: Conjunctivae normal.  Pupils: Pupils are equal, round, and reactive to light.  Cardiovascular:     Rate and Rhythm: Normal rate and regular rhythm.     Pulses: Normal pulses.     Heart sounds: Normal heart sounds.  Pulmonary:     Effort: Pulmonary effort is normal.     Breath sounds: Normal breath sounds.  Abdominal:     General: Bowel sounds are normal. There is no distension.     Palpations: Abdomen is soft.     Tenderness: There is no abdominal tenderness. There is no guarding.  Genitourinary:    General: Normal vulva.     Vagina: No vaginal discharge.  Musculoskeletal:        General: Normal range of motion.     Cervical back: Normal range of motion and neck supple. No tenderness.  Lymphadenopathy:     Cervical: No cervical adenopathy.  Skin:    General: Skin is warm and dry.  Neurological:     Mental Status: She is alert and oriented to person, place, and time.  Psychiatric:        Mood and Affect: Mood normal.        Behavior: Behavior normal.        Thought Content: Thought content normal.        Judgment: Judgment normal.    Results for orders placed or performed in visit on 12/15/19  CBC with Differential/Platelet  Result Value Ref Range   WBC 7.2 3.8 - 10.8 Thousand/uL   RBC 4.55 3.80 - 5.10 Million/uL   Hemoglobin 13.4 11.7 - 15.5 g/dL   HCT 39.9 35.0 - 45.0 %   MCV 87.7 80.0 - 100.0 fL   MCH 29.5 27.0 - 33.0 pg   MCHC 33.6 32.0 - 36.0 g/dL   RDW 12.8 11.0 - 15.0 %   Platelets 353 140 - 400 Thousand/uL   MPV 9.6 7.5 - 12.5 fL   Neutro Abs 4,320 1,500 - 7,800 cells/uL   Lymphs Abs 2,174 850 - 3,900 cells/uL   Absolute Monocytes 432 200 - 950 cells/uL   Eosinophils Absolute 230 15 - 500 cells/uL   Basophils Absolute 43 0 - 200 cells/uL   Neutrophils Relative % 60 %   Total  Lymphocyte 30.2 %   Monocytes Relative 6.0 %   Eosinophils Relative 3.2 %   Basophils Relative 0.6 %  Lipase  Result Value Ref Range   Lipase 13 7 - 60 U/L  COMPLETE METABOLIC PANEL WITH GFR  Result Value Ref Range   Glucose, Bld 98 65 - 139 mg/dL   BUN 11 7 - 25 mg/dL   Creat 0.58 0.50 - 1.10 mg/dL   GFR, Est Non African American 117 > OR = 60 mL/min/1.41m   GFR, Est African American 136 > OR = 60 mL/min/1.735m  BUN/Creatinine Ratio NOT APPLICABLE 6 - 22 (calc)   Sodium 138 135 - 146 mmol/L   Potassium 4.2 3.5 - 5.3 mmol/L   Chloride 106 98 - 110 mmol/L   CO2 25 20 - 32 mmol/L   Calcium 9.3 8.6 - 10.2 mg/dL   Total Protein 6.8 6.1 - 8.1 g/dL   Albumin 4.2 3.6 - 5.1 g/dL   Globulin 2.6 1.9 - 3.7 g/dL (calc)   AG Ratio 1.6 1.0 - 2.5 (calc)   Total Bilirubin 0.4 0.2 - 1.2 mg/dL   Alkaline phosphatase (APISO) 71 31 - 125 U/L   AST 13 10 - 30 U/L   ALT 13 6 - 29  U/L      Assessment & Plan:   Problem List Items Addressed This Visit   None Visit Diagnoses     Preventative health care    -  Primary   Relevant Orders   Lipid panel   Comprehensive metabolic panel   CBC   Cytology - PAP   Screening, lipid       Relevant Orders   Lipid panel   Screening for cervical cancer       Relevant Orders   Cytology - PAP   Need for immunization against influenza       Relevant Orders   Flu Vaccine QUAD 67moIM (Fluarix, Fluzone & Alfiuria Quad PF) (Completed)          LABORATORY TESTING:  - Health maintenance labs ordered today as discussed above.           CBC, CMP, LIPIDS          TSH - high risk or symptoms          A1c - hx, high risk, symptomatic  - STI testing: deferred - Pap smear: done today    IMMUNIZATIONS:   - Tdap: Tetanus vaccination status reviewed: last tetanus booster within 10 years. - Influenza: Administered today - Pneumovax: Not applicable - Prevnar: Refused - HPV: Not applicable - Shingrix vaccine: Not applicable - [id number] Up to  date  SCREENING: - Mammogram: Not applicable - Bone Density: Not applicable - Colonoscopy: Not applicable  Discussed with patient purpose of the colonoscopy is to detect colon cancer at curable precancerous or early stages  - AAA Screening: Not applicable  -Hearing Test: Not applicable  -Spirometry: Not applicable  - Lung Cancer Screening: Not applicable    PATIENT COUNSELING:   Advised to take 1 mg of folate supplement per day if capable of pregnancy.   Sexuality: Discussed sexually transmitted diseases, partner selection, use of condoms, avoidance of unintended pregnancy, and contraceptive alternatives.   Encouraged smoking cessation.   I discussed with the patient that most people either abstain from alcohol or drink within safe limits (<=14/week and <=4 drinks/occasion for males, <=7/weeks and <= 3 drinks/occasion for females) and that the risk for alcohol disorders and other health effects rises proportionally with the number of drinks per week and how often a drinker exceeds daily limits.  Discussed cessation/primary prevention of drug use and availability of treatment for abuse.   Diet: Encouraged to adjust caloric intake to maintain or achieve ideal body weight, to reduce intake of dietary saturated fat and total fat, to limit sodium intake by avoiding high sodium foods and not adding table salt, and to maintain adequate dietary potassium and calcium preferably from fresh fruits, vegetables, and low-fat dairy products. Encouraged vitamin D 1000 units and Calcium 13043mor 4 servings of dairy a day.  Emphasized the importance of regular exercise.  Injury prevention: Discussed safety belts, safety helmets, smoke detector, smoking near bedding or upholstery.   Dental health: Discussed importance of regular tooth brushing, flossing, and dental visits.  Follow up plan:  Return if symptoms worsen or fail to improve.   TaPurcell NailseOlevia BowensDNP, FNP-C

## 2021-01-04 LAB — COMPREHENSIVE METABOLIC PANEL
AG Ratio: 1.3 (calc) (ref 1.0–2.5)
ALT: 19 U/L (ref 6–29)
AST: 16 U/L (ref 10–30)
Albumin: 4.4 g/dL (ref 3.6–5.1)
Alkaline phosphatase (APISO): 81 U/L (ref 31–125)
BUN: 11 mg/dL (ref 7–25)
CO2: 28 mmol/L (ref 20–32)
Calcium: 9.8 mg/dL (ref 8.6–10.2)
Chloride: 103 mmol/L (ref 98–110)
Creat: 0.61 mg/dL (ref 0.50–0.97)
Globulin: 3.4 g/dL (calc) (ref 1.9–3.7)
Glucose, Bld: 97 mg/dL (ref 65–99)
Potassium: 4.7 mmol/L (ref 3.5–5.3)
Sodium: 138 mmol/L (ref 135–146)
Total Bilirubin: 0.4 mg/dL (ref 0.2–1.2)
Total Protein: 7.8 g/dL (ref 6.1–8.1)

## 2021-01-04 LAB — CBC
HCT: 44 % (ref 35.0–45.0)
Hemoglobin: 14.4 g/dL (ref 11.7–15.5)
MCH: 29.6 pg (ref 27.0–33.0)
MCHC: 32.7 g/dL (ref 32.0–36.0)
MCV: 90.3 fL (ref 80.0–100.0)
MPV: 9.2 fL (ref 7.5–12.5)
Platelets: 382 10*3/uL (ref 140–400)
RBC: 4.87 10*6/uL (ref 3.80–5.10)
RDW: 12.6 % (ref 11.0–15.0)
WBC: 11.4 10*3/uL — ABNORMAL HIGH (ref 3.8–10.8)

## 2021-01-04 LAB — LIPID PANEL
Cholesterol: 188 mg/dL (ref <200)
HDL: 68 mg/dL (ref >=50)
LDL Cholesterol (Calc): 94 mg/dL (calc)
Non-HDL Cholesterol (Calc): 120 mg/dL (calc) (ref <130)
Total CHOL/HDL Ratio: 2.8 (calc) (ref <5.0)
Triglycerides: 165 mg/dL — ABNORMAL HIGH (ref <150)

## 2021-01-05 LAB — CYTOLOGY - PAP
Comment: NEGATIVE
Diagnosis: NEGATIVE
High risk HPV: NEGATIVE

## 2021-03-07 ENCOUNTER — Other Ambulatory Visit: Payer: Self-pay | Admitting: Surgery

## 2021-07-26 ENCOUNTER — Telehealth: Payer: Self-pay | Admitting: Family Medicine

## 2021-07-26 DIAGNOSIS — K769 Liver disease, unspecified: Secondary | ICD-10-CM

## 2021-07-26 NOTE — Telephone Encounter (Signed)
This call patient and let her know that she is due for her 57-monthfollow-up for the liver lesion that was noted on her imaging previously.  If she is okay with scheduling it then let uKoreaknow. ?

## 2021-07-27 NOTE — Telephone Encounter (Signed)
LVM advising pt to call back about this. ?

## 2021-08-03 NOTE — Telephone Encounter (Signed)
Left another message for patient to call back 

## 2021-08-09 NOTE — Telephone Encounter (Signed)
Left message for patient to call back  

## 2021-08-22 ENCOUNTER — Other Ambulatory Visit: Payer: Self-pay | Admitting: Family Medicine

## 2021-08-22 ENCOUNTER — Other Ambulatory Visit: Payer: Self-pay | Admitting: *Deleted

## 2021-08-22 NOTE — Telephone Encounter (Signed)
Spoke w/pt she informed me that she is unable to have this done at this time due to her husband not working and not having health insurance. She will contact us when she is able to do this.  ?

## 2022-12-24 ENCOUNTER — Encounter: Payer: Self-pay | Admitting: Medical-Surgical

## 2022-12-24 ENCOUNTER — Ambulatory Visit (INDEPENDENT_AMBULATORY_CARE_PROVIDER_SITE_OTHER): Payer: Managed Care, Other (non HMO) | Admitting: Medical-Surgical

## 2022-12-24 VITALS — BP 127/87 | HR 74 | Resp 20 | Ht 66.5 in | Wt 250.9 lb

## 2022-12-24 DIAGNOSIS — Z30432 Encounter for removal of intrauterine contraceptive device: Secondary | ICD-10-CM | POA: Diagnosis not present

## 2022-12-24 DIAGNOSIS — R5383 Other fatigue: Secondary | ICD-10-CM | POA: Diagnosis not present

## 2022-12-24 DIAGNOSIS — K769 Liver disease, unspecified: Secondary | ICD-10-CM

## 2022-12-24 DIAGNOSIS — Z3009 Encounter for other general counseling and advice on contraception: Secondary | ICD-10-CM | POA: Diagnosis not present

## 2022-12-24 DIAGNOSIS — R1011 Right upper quadrant pain: Secondary | ICD-10-CM

## 2022-12-24 DIAGNOSIS — K649 Unspecified hemorrhoids: Secondary | ICD-10-CM

## 2022-12-24 DIAGNOSIS — Z13220 Encounter for screening for lipoid disorders: Secondary | ICD-10-CM

## 2022-12-24 DIAGNOSIS — R0683 Snoring: Secondary | ICD-10-CM

## 2022-12-24 DIAGNOSIS — N393 Stress incontinence (female) (male): Secondary | ICD-10-CM

## 2022-12-24 DIAGNOSIS — Z6839 Body mass index (BMI) 39.0-39.9, adult: Secondary | ICD-10-CM

## 2022-12-24 MED ORDER — NORETHINDRONE 0.35 MG PO TABS
1.0000 | ORAL_TABLET | Freq: Every day | ORAL | 11 refills | Status: DC
Start: 2022-12-24 — End: 2023-05-07

## 2022-12-24 MED ORDER — OMEPRAZOLE 40 MG PO CPDR: 40.0000 mg | DELAYED_RELEASE_CAPSULE | Freq: Every day | ORAL | 1 refills | Status: DC

## 2022-12-24 MED ORDER — MELOXICAM 15 MG PO TABS: 15.0000 mg | ORAL_TABLET | Freq: Every day | ORAL | 0 refills | Status: DC

## 2022-12-24 MED ORDER — FAMOTIDINE 20 MG PO TABS
20.0000 mg | ORAL_TABLET | Freq: Two times a day (BID) | ORAL | 3 refills | Status: DC
Start: 2022-12-24 — End: 2023-05-07

## 2022-12-24 MED ORDER — ZEPBOUND 2.5 MG/0.5ML ~~LOC~~ SOAJ
2.5000 mg | SUBCUTANEOUS | 0 refills | Status: DC
Start: 2022-12-24 — End: 2023-01-28

## 2022-12-24 NOTE — Progress Notes (Signed)
Established patient visit  History, exam, impression, and plan:  1. Encounter for IUD removal Pleasant 42 year old female presenting today for IUD removal.  She notes that her IUD has been in place for approximately 6 years and would like it removed but is not interested in having it replaced.  Open to other options for birth control.  See below.  Procedure note below.  2. Counseling for birth control, oral contraceptives Briefly discussed options for birth control.  She is a smoker, over the age of 55, and has a history of migraine with aura so we are avoiding estrogen containing products.  Discussed Depo-Provera, Nexplanon, ParaGard, and Micronor.  She would like to start with Micronor as she previously did not tolerate progesterone only options.  Starting Micronor 1 tablet daily.  Plan to follow-up in approximately 6 weeks.  If doing okay on this, may consider switching over to Depo-Provera. - norethindrone (ORTHO MICRONOR) 0.35 MG tablet; Take 1 tablet (0.35 mg total) by mouth daily.  Dispense: 28 tablet; Refill: 11  3. RUQ pain Continues to have issues with right upper quadrant abdominal pain despite having her gallbladder out.  Last abdominal ultrasound showed a lesion on her liver that was due to be followed up on in about 6 months.  This fell through and has not been rechecked.  Ordering RUQ ultrasound today. - US ABDOMEN LIMITED RUQ (LIVER/GB); Future  4. Fatigue, unspecified type Expresses significant issues with fatigue.  She is not exercising and knows that weight is an issue but feels that her overall fatigue level is horrible.  Admits to frequent waking when trying to sleep and having poor sleep quality.  Feels that she gets irritable and snippy and wonders if this is related to perimenopausal changes.  Plan to check labs as below to rule out metabolic causes of fatigue. - CBC with Differential/Platelet - CMP14+EGFR - Vitamin B12 - VITAMIN D 25 Hydroxy (Vit-D Deficiency,  Fractures) - Iron, TIBC and Ferritin Panel - TSH  5. Loud snoring History of loud snoring.  Sleep study completed quite few years ago but she reports that she did not have good sleep during this time.  Is interested in being rechecked for sleep apnea.  Ordering home sleep study today. - Home sleep test; Future  6. Class 2 severe obesity due to excess calories with serious comorbidity and body mass index (BMI) of 39.0 to 39.9 in adult [facility]) Checking hemoglobin A1c and TSH.  Has had significant issues with obesity without good response to dietary and lifestyle modifications.  Interested in medications that may help.  Discussed recommendations for dietary modification including a high-protein diet with moderate carbs and low fat.  Would like her to start regular intentional exercise using cardiovascular and strength training exercises at least 3 times weekly.  We will try for Zepbound to see if her insurance will cover this. - Hemoglobin A1c - TSH - tirzepatide (ZEPBOUND) 2.5 MG/0.5ML Pen; Inject 2.5 mg into the skin once a week.  Dispense: 2 mL; Refill: 0  7. Liver lesion Following up with repeat ultrasound as noted above.  8. Hemorrhoids, unspecified hemorrhoid type Has significant issues with hemorrhoids that are quite bothersome.  These developed when she was pregnant with her child and have not resolved or improved.  Has tried over-the-counter agents and prescribed suppositories with no benefit.  Interested in definitive treatment.  Referring to GI. - Ambulatory referral to Gastroenterology  9. Stress incontinence Also has issues with stress  incontinence that impact her on a daily basis.  Interested in options for treatment.  Has tried doing Kegel's however these have not been helpful.  Referring to pelvic physical therapy. - Ambulatory referral to Physical Therapy  10. Screening, lipid Checking lipids today. - Lipid panel   Procedures performed this visit: IUD removal: Procedure  note Patient was given informed consent, signed copy in the chart. Appropriate time out was taken. IUD strings easily visualized from external os.  Strings grasped with ring forceps and with patient giving increased abd pressure, strings pulled and IUD easily removed intact.  Patient tolerated procedure well.  Discussed return precautions.   Return in about 6 weeks (around 02/04/2023), or birth control/weight loss follow up.  __________________________________ Thayer Ohm, DNP, APRN, FNP-BC Primary Care and Sports Medicine [facility] [place]

## 2022-12-25 LAB — CBC WITH DIFFERENTIAL/PLATELET
Basophils Absolute: 0 10*3/uL (ref 0.0–0.2)
Basos: 0 %
EOS (ABSOLUTE): 0.2 10*3/uL (ref 0.0–0.4)
Eos: 2 %
Hematocrit: 44.2 % (ref 34.0–46.6)
Hemoglobin: 14.2 g/dL (ref 11.1–15.9)
Immature Grans (Abs): 0 10*3/uL (ref 0.0–0.1)
Immature Granulocytes: 0 %
Lymphocytes Absolute: 3 10*3/uL (ref 0.7–3.1)
Lymphs: 32 %
MCH: 29.5 pg (ref 26.6–33.0)
MCHC: 32.1 g/dL (ref 31.5–35.7)
MCV: 92 fL (ref 79–97)
Monocytes Absolute: 0.4 10*3/uL (ref 0.1–0.9)
Monocytes: 4 %
Neutrophils Absolute: 5.6 10*3/uL (ref 1.4–7.0)
Neutrophils: 62 %
Platelets: 376 10*3/uL (ref 150–450)
RBC: 4.81 x10E6/uL (ref 3.77–5.28)
RDW: 12.8 % (ref 11.7–15.4)
WBC: 9.2 10*3/uL (ref 3.4–10.8)

## 2022-12-25 LAB — CMP14+EGFR
ALT: 23 IU/L (ref 0–32)
AST: 18 IU/L (ref 0–40)
Albumin: 4.6 g/dL (ref 3.9–4.9)
Alkaline Phosphatase: 82 IU/L (ref 44–121)
BUN/Creatinine Ratio: 19 (ref 9–23)
BUN: 11 mg/dL (ref 6–24)
Bilirubin Total: 0.2 mg/dL (ref 0.0–1.2)
CO2: 22 mmol/L (ref 20–29)
Calcium: 9.7 mg/dL (ref 8.7–10.2)
Chloride: 102 mmol/L (ref 96–106)
Creatinine, Ser: 0.58 mg/dL (ref 0.57–1.00)
Globulin, Total: 2.9 g/dL (ref 1.5–4.5)
Glucose: 87 mg/dL (ref 70–99)
Potassium: 4.3 mmol/L (ref 3.5–5.2)
Sodium: 139 mmol/L (ref 134–144)
Total Protein: 7.5 g/dL (ref 6.0–8.5)
eGFR: 117 mL/min/{1.73_m2} (ref >59)

## 2022-12-25 LAB — TSH: TSH: 1.14 u[IU]/mL (ref 0.450–4.500)

## 2022-12-25 LAB — IRON,TIBC AND FERRITIN PANEL
Ferritin: 164 ng/mL — ABNORMAL HIGH (ref 15–150)
Iron Saturation: 23 % (ref 15–55)
Iron: 67 ug/dL (ref 27–159)
Total Iron Binding Capacity: 292 ug/dL (ref 250–450)
UIBC: 225 ug/dL (ref 131–425)

## 2022-12-25 LAB — LIPID PANEL
Chol/HDL Ratio: 2.5 ratio (ref 0.0–4.4)
Cholesterol, Total: 185 mg/dL (ref 100–199)
HDL: 74 mg/dL (ref >39)
LDL Chol Calc (NIH): 99 mg/dL (ref 0–99)
Triglycerides: 63 mg/dL (ref 0–149)
VLDL Cholesterol Cal: 12 mg/dL (ref 5–40)

## 2022-12-25 LAB — HEMOGLOBIN A1C
Est. average glucose Bld gHb Est-mCnc: 114 mg/dL
Hgb A1c MFr Bld: 5.6 % (ref 4.8–5.6)

## 2022-12-25 LAB — VITAMIN D 25 HYDROXY (VIT D DEFICIENCY, FRACTURES): Vit D, 25-Hydroxy: 27.1 ng/mL — ABNORMAL LOW (ref 30.0–100.0)

## 2022-12-25 LAB — VITAMIN B12: Vitamin B-12: 747 pg/mL (ref 232–1245)

## 2023-01-02 ENCOUNTER — Telehealth: Payer: Self-pay

## 2023-01-02 ENCOUNTER — Ambulatory Visit: Payer: Managed Care, Other (non HMO)

## 2023-01-02 DIAGNOSIS — R1011 Right upper quadrant pain: Secondary | ICD-10-CM | POA: Diagnosis not present

## 2023-01-02 NOTE — Telephone Encounter (Signed)
Patient came into office to see about the status of her PA for her medication Ozempic, please advise thanks.

## 2023-01-04 ENCOUNTER — Encounter: Payer: Self-pay | Admitting: Medical-Surgical

## 2023-01-11 ENCOUNTER — Telehealth: Payer: Self-pay

## 2023-01-11 NOTE — Telephone Encounter (Signed)
Initiated Prior authorization UEA:[id number] 2.5MG /0.5ML pen-injectors Via: Covermymeds Case/Key:[id number] Status: approved  as of 01/11/23 Reason:Authorization Expiration Date: 09/04/2023 Notified Pt via: Mychart

## 2023-01-15 ENCOUNTER — Telehealth: Payer: Self-pay | Admitting: Medical-Surgical

## 2023-01-15 NOTE — Telephone Encounter (Signed)
Sleep study orders, clinical notes, demographics and copies of insurance cards have been faxed to [facility] at [phone]. Office will contact patient to complete online registration in order to ship sleep study equipment.

## 2023-01-28 ENCOUNTER — Encounter: Payer: Self-pay | Admitting: Medical-Surgical

## 2023-01-28 MED ORDER — ZEPBOUND 5 MG/0.5ML ~~LOC~~ SOAJ: 5.0000 mg | SUBCUTANEOUS | 0 refills | Status: DC

## 2023-02-01 MED ORDER — ZEPBOUND 5 MG/0.5ML ~~LOC~~ SOAJ: 5.0000 mg | SUBCUTANEOUS | 0 refills | Status: DC

## 2023-02-01 NOTE — Addendum Note (Signed)
Addended by: Chalmers Cater on: 02/01/2023 08:57 AM   Modules accepted: Orders

## 2023-02-04 ENCOUNTER — Encounter: Payer: Self-pay | Admitting: Medical-Surgical

## 2023-02-04 ENCOUNTER — Ambulatory Visit: Payer: Managed Care, Other (non HMO) | Admitting: Medical-Surgical

## 2023-02-04 VITALS — BP 120/83 | HR 68 | Resp 20 | Ht 66.5 in | Wt 240.8 lb

## 2023-02-04 DIAGNOSIS — E66812 Obesity, class 2: Secondary | ICD-10-CM | POA: Diagnosis not present

## 2023-02-04 DIAGNOSIS — Z3041 Encounter for surveillance of contraceptive pills: Secondary | ICD-10-CM | POA: Diagnosis not present

## 2023-02-04 DIAGNOSIS — M545 Low back pain, unspecified: Secondary | ICD-10-CM | POA: Diagnosis not present

## 2023-02-04 DIAGNOSIS — Z6839 Body mass index (BMI) 39.0-39.9, adult: Secondary | ICD-10-CM

## 2023-02-04 MED ORDER — CYCLOBENZAPRINE HCL 10 MG PO TABS: 5.0000 mg | ORAL_TABLET | Freq: Three times a day (TID) | ORAL | 0 refills | Status: DC | PRN

## 2023-02-04 MED ORDER — MELOXICAM 15 MG PO TABS: 15.0000 mg | ORAL_TABLET | Freq: Every day | ORAL | 0 refills | Status: DC

## 2023-02-04 MED ORDER — ONDANSETRON HCL 4 MG PO TABS: 4.0000 mg | ORAL_TABLET | Freq: Three times a day (TID) | ORAL | 0 refills | Status: DC | PRN

## 2023-02-04 NOTE — Progress Notes (Signed)
        Established patient visit  History, exam, impression, and plan:  1. Surveillance for birth control, oral contraceptives Pleasant 42 year old female presenting today for follow-up on birth control.  Approximately 6 weeks ago, she was started on Micronor 1 tablet daily.  Reports that she is taking it daily without missed doses and tolerating it well.  No side effects or concerns.  She did have some irregular bleeding and has started her menstrual cycle a little earlier than expected.  Is also seemed to be a little moodier than usual according to her husband.  She is aware that her body will take some time to get settled into this and plans to give another couple of months at a minimum.  Discussed the expectations regarding taking a daily oral birth control versus an IUD.  She is aware of the risk of irregular bleeding as well as mood changes.  If this becomes intolerable, we will plan to place another IUD here in the office.  2. Class 2 severe obesity due to excess calories with serious comorbidity and body mass index (BMI) of 39.0 to 39.9 in adult [facility]) Has been taking Zepbound 2.5 mg weekly x 4 weeks.  Tolerating well without side effects.  Has been able to get the 5 mg weekly dose and plans to start that today.  Notes that she has had some diarrhea when she eats foods that she probably should not, especially steak or ground beef.  Otherwise making healthy dietary choices and aiming to increase protein intake on a daily basis.  Has not gotten around to starting exercise but that is on the to do list.  Has lost approximately 10-11 pounds in the last month and is happy with the results.  Continue Zepbound as prescribed.  Plan for 4 weeks at 5 mg weekly then evaluate if we need to increase her dose or if she would like to stay here for a little longer.  Work on incorporating regular intentional exercise.  3. Acute bilateral low back pain without sciatica Has had some issues with her back in the past  but lately has had small intermittent flares of this.  Her most recent flare has caused her to have difficulty with lying in certain positions at night.  Has tried taking meloxicam and Tylenol but this is not very effective.  No red flag symptoms and able to stand and ambulate without difficulty.  Discussed possible need for x-rays but declined today.  Adding Flexeril 5-10 mg 3 times daily as needed during flares.  Continue meloxicam and as needed Tylenol.  Procedures performed this visit: None.  Return in about 3 months (around 05/07/2023) for weight check/BC follow up.  __________________________________ Thayer Ohm, DNP, APRN, FNP-BC Primary Care and Sports Medicine [facility] [place]

## 2023-02-06 NOTE — Telephone Encounter (Signed)
02/06/23-Per [facility] notes, patient will delay Home sleep study test and will contact [facility] to schedule home sleep study equipment delivery when ready.

## 2023-02-28 ENCOUNTER — Other Ambulatory Visit: Payer: Self-pay | Admitting: Medical-Surgical

## 2023-03-05 ENCOUNTER — Other Ambulatory Visit: Payer: Self-pay | Admitting: Medical-Surgical

## 2023-04-01 ENCOUNTER — Encounter: Payer: Self-pay | Admitting: Medical-Surgical

## 2023-04-01 ENCOUNTER — Other Ambulatory Visit: Payer: Self-pay | Admitting: Medical-Surgical

## 2023-04-01 MED ORDER — ONDANSETRON HCL 4 MG PO TABS: 4.0000 mg | ORAL_TABLET | Freq: Three times a day (TID) | ORAL | 0 refills | Status: DC | PRN

## 2023-04-01 MED ORDER — ZEPBOUND 5 MG/0.5ML ~~LOC~~ SOAJ: 5.0000 mg | SUBCUTANEOUS | 0 refills | Status: DC

## 2023-04-18 ENCOUNTER — Encounter: Payer: Self-pay | Admitting: Gastroenterology

## 2023-04-19 ENCOUNTER — Ambulatory Visit: Payer: Managed Care, Other (non HMO) | Admitting: Gastroenterology

## 2023-04-25 ENCOUNTER — Other Ambulatory Visit: Payer: Self-pay | Admitting: Medical-Surgical

## 2023-05-07 ENCOUNTER — Encounter: Payer: Self-pay | Admitting: Medical-Surgical

## 2023-05-07 ENCOUNTER — Ambulatory Visit: Payer: Managed Care, Other (non HMO) | Admitting: Medical-Surgical

## 2023-05-07 VITALS — BP 110/74 | HR 88 | Resp 20 | Ht 66.5 in | Wt 212.5 lb

## 2023-05-07 DIAGNOSIS — Z3009 Encounter for other general counseling and advice on contraception: Secondary | ICD-10-CM | POA: Diagnosis not present

## 2023-05-07 DIAGNOSIS — Z6839 Body mass index (BMI) 39.0-39.9, adult: Secondary | ICD-10-CM

## 2023-05-07 DIAGNOSIS — E66812 Obesity, class 2: Secondary | ICD-10-CM

## 2023-05-07 DIAGNOSIS — Z3041 Encounter for surveillance of contraceptive pills: Secondary | ICD-10-CM | POA: Diagnosis not present

## 2023-05-07 MED ORDER — ZEPBOUND 5 MG/0.5ML ~~LOC~~ SOAJ: 5.0000 mg | SUBCUTANEOUS | 1 refills | Status: DC

## 2023-05-07 MED ORDER — NORETHINDRONE 0.35 MG PO TABS: 1.0000 | ORAL_TABLET | Freq: Every day | ORAL | 4 refills | Status: DC

## 2023-05-07 NOTE — Progress Notes (Signed)
        Established patient visit  History, exam, impression, and plan:  1. Class 2 severe obesity due to excess calories with serious comorbidity and body mass index (BMI) of 39.0 to 39.9 in adult [facility]) (Primary) Pleasant 43 year old female presenting today for follow-up on weight management.  She started Zepbound in 12/2022.  She is now currently on Zepbound 5 mg weekly and doing extremely well.  Minimal side effects from the medication although she does admit that it causes diarrhea a couple of days after taking it.  This is not a deal breaker and she manages well with it.  Has a significantly reduced appetite and is aiming for healthier options.  Has not been able to work exercise into her routine yet due to persistent fatigue but is aware of recommendations.  Since starting Zepbound she has lost almost 40 pounds and is very happy with her progress.  Her initial goal weight was 190 pounds however she has reevaluated with her recent efforts and thinks that 170 pounds would be good for her.  Overall doing beautifully on the medicine so continuing Zepbound 5 mg weekly.  She will be due for resubmission of her prior authorization near the end of May.  2. Surveillance for birth control, oral contraceptives Taking Micronor 1 tablet daily, tolerating well without side effects.  Notes her periods have regulated.  She does have a little bit more cramping than she did several years ago but understands that this seems to be part of the aging process for her.  No red flag symptoms.  Otherwise is happy with the pill and would like to continue.  Refilling Micronor today. - norethindrone (ORTHO MICRONOR) 0.35 MG tablet; Take 1 tablet (0.35 mg total) by mouth daily.  Dispense: 84 tablet; Refill: 4   Procedures performed this visit: None.  Return in about 6 months (around 11/04/2023) for weight check.  __________________________________ Thayer Ohm, DNP, APRN, FNP-BC Primary Care and Sports Medicine [facility]

## 2023-05-28 ENCOUNTER — Other Ambulatory Visit: Payer: Self-pay | Admitting: Medical-Surgical

## 2023-06-24 ENCOUNTER — Other Ambulatory Visit: Payer: Self-pay | Admitting: Medical-Surgical

## 2023-07-01 ENCOUNTER — Ambulatory Visit: Payer: Managed Care, Other (non HMO) | Admitting: Gastroenterology

## 2023-07-01 ENCOUNTER — Encounter: Payer: Self-pay | Admitting: Gastroenterology

## 2023-07-01 VITALS — BP 122/80 | HR 65 | Ht 67.0 in | Wt 205.0 lb

## 2023-07-01 DIAGNOSIS — K644 Residual hemorrhoidal skin tags: Secondary | ICD-10-CM | POA: Diagnosis not present

## 2023-07-01 DIAGNOSIS — K648 Other hemorrhoids: Secondary | ICD-10-CM | POA: Diagnosis not present

## 2023-07-01 DIAGNOSIS — R194 Change in bowel habit: Secondary | ICD-10-CM

## 2023-07-01 DIAGNOSIS — K921 Melena: Secondary | ICD-10-CM

## 2023-07-01 DIAGNOSIS — K219 Gastro-esophageal reflux disease without esophagitis: Secondary | ICD-10-CM

## 2023-07-01 DIAGNOSIS — K641 Second degree hemorrhoids: Secondary | ICD-10-CM

## 2023-07-01 DIAGNOSIS — R12 Heartburn: Secondary | ICD-10-CM

## 2023-07-01 MED ORDER — SUFLAVE 178.7 G PO SOLR: 1.0000 | Freq: Once | ORAL | 0 refills | Status: AC

## 2023-07-01 NOTE — Progress Notes (Signed)
 Chief Complaint: Symptomatic hemorrhoids   Referring Provider:     Christen Butter, NP   HPI:     Kristin [name] is a 43 y.o. female with a history of depression, migraines, cholecystectomy, referred to the Gastroenterology Clinic for evaluation of symptomatic hemorrhoids.  Has had perianal sxs for 6 years. Started after childbirth. Feels like stool gets stuck and will have to manually assist to facilitate stool passage. Difficulty cleaning area. Intermittent BRB on tissue paper. Sxs worse with hard stools, but has BM daily. Has tried Proctofoam and Preparation H 6 years ago; Nothing recently.   Separately, does have a hx of GERD and on omperazole 40 mg/day for 12 years. Previously trialed Tums, Rolaids. Well controlled on current therapy. No dysphagia. Has breakthrough heartburn and regurgitation with any missed dose.    Does have a history of obesity, has been on Zepbound since 12/2022 with appropriate weight loss.   Reviewed most recent labs from 9/24: Normal CBC, CMP, B12, iron panel, A1c, TSH.  Vitamin D 27 and was started on OTC supplement.  No previous EGD or colonoscopy.  Father with GERD, colon polyps, and ccy. No Fhx of IBD, colon cancer.   Past Medical History:  Diagnosis Date   Abnormal Pap smear of cervix 04/16/2006   Arthritis    Depression    migrane   Gallstones    History of dysmenorrhea    Migraine    Obesity    Ulcer    stomach   Vein, varicose      Past Surgical History:  Procedure Laterality Date   GALLBLADDER SURGERY     LEEP  04/16/2006   WISDOM TOOTH EXTRACTION     x3   Family History  Problem Relation Age of Onset   Breast cancer Mother 39       neg for BRCA gene   Hyperlipidemia Father    Breast cancer Maternal Grandmother        breast   Depression Maternal Grandfather    Diabetes Maternal Grandfather    Depression Paternal Grandmother    Hyperlipidemia Paternal Grandmother    Hyperlipidemia Paternal Grandfather     Down syndrome Paternal Uncle    Liver disease Neg Hx    Esophageal cancer Neg Hx    Colon cancer Neg Hx    Social History   Tobacco Use   Smoking status: Former    Current packs/day: 0.00    Types: Cigarettes    Quit date: 12/16/2015    Years since quitting: 7.5   Smokeless tobacco: Never  Vaping Use   Vaping status: Never Used  Substance Use Topics   Alcohol use: No    Alcohol/week: 7.0 - 10.0 standard drinks of alcohol    Types: 7 - 10 Standard drinks or equivalent per week    Comment: " a few drinks a night"   Drug use: No   Current Outpatient Medications  Medication Sig Dispense Refill   norethindrone (ORTHO MICRONOR) 0.35 MG tablet Take 1 tablet (0.35 mg total) by mouth daily. 84 tablet 4   omeprazole (PRILOSEC) 40 MG capsule TAKE 1 CAPSULE BY MOUTH EVERY DAY 90 capsule 1   ondansetron (ZOFRAN) 4 MG tablet Take 1 tablet (4 mg total) by mouth every 8 (eight) hours as needed for nausea or vomiting. 20 tablet 0   tirzepatide (ZEPBOUND) 5 MG/0.5ML Pen Inject 5 mg into the skin once a week. 6 mL  1   cyclobenzaprine (FLEXERIL) 10 MG tablet Take 0.5-1 tablets (5-10 mg total) by mouth 3 (three) times daily as needed for muscle spasms. (Patient not taking: Reported on 07/01/2023) 30 tablet 0   meloxicam (MOBIC) 15 MG tablet Take 1 tablet (15 mg total) by mouth daily. (Patient not taking: Reported on 07/01/2023) 30 tablet 0   No current facility-administered medications for this visit.   Allergies  Allergen Reactions   Vicodin [Hydrocodone-Acetaminophen] Nausea And Vomiting     Review of Systems: All systems reviewed and negative except where noted in HPI.     Physical Exam:    Wt Readings from Last 3 Encounters:  07/01/23 205 lb (93 kg)  05/07/23 212 lb 8 oz (96.4 kg)  02/04/23 240 lb 12.8 oz (109.2 kg)    Ht 5\' 7"  (1.702 m)   Wt 205 lb (93 kg)   BMI 32.11 kg/m  Constitutional:  Pleasant, in no acute distress. Psychiatric: Normal mood and affect. Behavior is  normal. Cardiovascular: Normal rate, regular rhythm. No edema Pulmonary/chest: Effort normal and breath sounds normal. No wheezing, rales or rhonchi. Abdominal: Soft, nondistended, nontender. Bowel sounds active throughout. There are no masses palpable. No hepatomegaly. Neurological: Alert and oriented to person place and time. Skin: Skin is warm and dry. No rashes noted. Rectal exam: Sensation intact and preserved anal wink.  External skin tags.  No external anal fissures. Normal sphincter tone. No palpable mass. No blood on the exam glove.  3 columns of grade 2 hemorrhoids on anoscopy.  (Chaperone: Thompson Grayer, CMA)    ASSESSMENT AND PLAN;   1) Internal hemorrhoids 2) Perianal Skin tags 3) Change in bowel habits 4) Hematochezia 6+ year history of perianal symptoms, presumed 2/2 hemorrhoids and skin tags.  Does also have difficulty facilitating BM at times.  Unsure if that is related to a concomitant anal sphincter muscle issue.  Exam today with grade 2 hemorrhoids and skin tags. - Colonoscopy to evaluate for additional Nikkel/luminal pathology - Depending on colonoscopy findings, plan for referral to Colorectal Surgery Clinic for evaluation/treatment of hemorrhoids and symptomatic skin tags - Depending on colonoscopy findings, may consider anorectal manometry - Fiber supplement  5) GERD 6) Heartburn - Continue Prilosec as currently prescribed - EGD to evaluate for erosive esophagitis, LES laxity, hiatal hernia and for preoperative assessment for potential alternative treatment to reflux - Continue antireflux lifestyle/dietary modifications  The indications, risks, and benefits of EGD and colonoscopy were explained to the patient in detail. Risks include but are not limited to bleeding, perforation, adverse reaction to medications, and cardiopulmonary compromise. Sequelae include but are not limited to the possibility of surgery, hospitalization, and mortality. The patient verbalized  understanding and wished to proceed. All questions answered, referred to scheduler and bowel prep ordered. Further recommendations pending results of the exam.      Shellia Cleverly, DO, FACG  07/01/2023, 9:04 AM   Christen Butter, NP

## 2023-07-01 NOTE — Patient Instructions (Signed)
 You have been scheduled for an endoscopy and colonoscopy. Please follow the written instructions given to you at your visit today.  If you use inhalers (even only as needed), please bring them with you on the day of your procedure.  DO NOT TAKE 7 DAYS PRIOR TO TEST- Trulicity (dulaglutide) Ozempic, Wegovy (semaglutide) Mounjaro (tirzepatide) Bydureon Bcise (exanatide extended release)  DO NOT TAKE 1 DAY PRIOR TO YOUR TEST Rybelsus (semaglutide) Adlyxin (lixisenatide) Victoza (liraglutide) Byetta (exanatide) ___________________________________________________________________________  Kristin [name] will receive your bowel preparation through [facility], which ensures the lowest copay and home delivery, with outreach via text or call from an [phone] number. Please respond promptly to avoid rescheduling of your procedure. If you are interested in alternative options or have any questions regarding your prep, please contact them at [phone] ____________________________________________________________________________  Your Provider Has Sent Your Bowel Prep Regimen To [facility]   [facility] will contact you to verify your information and collect your copay, if applicable. Enjoy the comfort of your home while your prescription is mailed to you, FREE of any shipping charges.   [facility] accepts all major insurance benefits and applies discounts & coupons.  Have additional questions?   Chat: [email or web address] Call: [phone] Email: [email or web address] NCPDP: [id number]  How will [facility] contact you?  With a Welcome phone call,  a Welcome text and a checkout link in text form.  Texts you receive from [phone] Are NOT Spam.  *To set up delivery, you must complete the checkout process via link or speak to one of the patient care representatives. If [facility] is unable to reach you, your prescription may be delayed.  To avoid long hold times on the phone, you may also  utilize the secure chat feature on the [facility] website to request that they call you back for transaction completion or to expedite your concerns.  Due to recent changes in healthcare laws, you may see the results of your imaging and laboratory studies on MyChart before your provider has had a chance to review them.  We understand that in some cases there may be results that are confusing or concerning to you. Not all laboratory results come back in the same time frame and the provider may be waiting for multiple results in order to interpret others.  Please give [place] 48 hours in order for your provider to thoroughly review all the results before contacting the office for clarification of your results.    _______________________________________________________  If your blood pressure at your visit was 140/90 or greater, please contact your primary care physician to follow up on this.  _______________________________________________________  If you are age 42 or older, your body mass index should be between 23-30. Your Body mass index is 32.11 kg/m. If this is out of the aforementioned range listed, please consider follow up with your Primary Care Provider.  If you are age 42 or younger, your body mass index should be between 19-25. Your Body mass index is 32.11 kg/m. If this is out of the aformentioned range listed, please consider follow up with your Primary Care Provider.   ________________________________________________________  The [facility] providers would like to encourage you to use [facility] to communicate with providers for non-urgent requests or questions.  Due to long hold times on the telephone, sending your provider a message by [facility] may be a faster and more efficient way to get a response.  Please allow 48 business hours for a response.  Please remember that this is for non-urgent requests.  _______________________________________________________  It  was a pleasure to see you  today!  Vito Cirigliano, D.O.

## 2023-07-24 ENCOUNTER — Telehealth: Payer: Self-pay | Admitting: Gastroenterology

## 2023-07-24 MED ORDER — NA SULFATE-K SULFATE-MG SULF 17.5-3.13-1.6 GM/177ML PO SOLN: 1.0000 | Freq: Once | ORAL | 0 refills | Status: AC

## 2023-07-24 NOTE — Addendum Note (Signed)
 Addended by: Bertram Savin on: 07/24/2023 09:17 AM   Modules accepted: Orders

## 2023-07-24 NOTE — Telephone Encounter (Signed)
 Inbound call from patient stating [facility] does not accept card that she uses to pay for medications. Requesting for medication to be sent to pharmacy instead. Please advise, thank you.

## 2023-08-13 ENCOUNTER — Ambulatory Visit: Admitting: Gastroenterology

## 2023-08-13 ENCOUNTER — Encounter: Payer: Self-pay | Admitting: Gastroenterology

## 2023-08-13 VITALS — BP 111/69 | HR 69 | Temp 98.5°F | Resp 17 | Ht 67.0 in | Wt 205.0 lb

## 2023-08-13 DIAGNOSIS — K641 Second degree hemorrhoids: Secondary | ICD-10-CM | POA: Diagnosis not present

## 2023-08-13 DIAGNOSIS — K5730 Diverticulosis of large intestine without perforation or abscess without bleeding: Secondary | ICD-10-CM | POA: Diagnosis not present

## 2023-08-13 DIAGNOSIS — K644 Residual hemorrhoidal skin tags: Secondary | ICD-10-CM

## 2023-08-13 DIAGNOSIS — K219 Gastro-esophageal reflux disease without esophagitis: Secondary | ICD-10-CM | POA: Diagnosis not present

## 2023-08-13 DIAGNOSIS — K635 Polyp of colon: Secondary | ICD-10-CM

## 2023-08-13 DIAGNOSIS — D125 Benign neoplasm of sigmoid colon: Secondary | ICD-10-CM

## 2023-08-13 MED ORDER — SODIUM CHLORIDE 0.9 % IV SOLN: 500.0000 mL | Freq: Once | INTRAVENOUS | Status: DC

## 2023-08-13 NOTE — Patient Instructions (Addendum)
 - Resume previous diet.                           - Continue present medications.                           - 1 polyp removed and sent to pathology.                            - Internal hemorrhoids and diverticulosis.                           - Await pathology results.                           - Repeat colonoscopy in 7-10 years for surveillance                            based on pathology results.  YOU HAD AN ENDOSCOPIC PROCEDURE TODAY AT THE [facility]:   Refer to the procedure report that was given to you for any specific questions about what was found during the examination.  If the procedure report does not answer your questions, please call your gastroenterologist to clarify.  If you requested that your care partner not be given the details of your procedure findings, then the procedure report has been included in a sealed envelope for you to review at your convenience later.  YOU SHOULD EXPECT: Some feelings of bloating in the abdomen. Passage of more gas than usual.  Walking can help get rid of the air that was put into your GI tract during the procedure and reduce the bloating. If you had a lower endoscopy (such as a colonoscopy or flexible sigmoidoscopy) you may notice spotting of blood in your stool or on the toilet paper. If you underwent a bowel prep for your procedure, you may not have a normal bowel movement for a few days.  Please Note:  You might notice some irritation and congestion in your nose or some drainage.  This is from the oxygen used during your procedure.  There is no need for concern and it should clear up in a day or so.  SYMPTOMS TO REPORT IMMEDIATELY:  Following lower endoscopy (colonoscopy or flexible sigmoidoscopy):  Excessive amounts of blood in the stool  Significant tenderness or worsening of abdominal pains  Swelling of the abdomen that is new, acute  Fever of 100F or higher  Following upper endoscopy  (EGD)  Vomiting of blood or coffee ground material  New chest pain or pain under the shoulder blades  Painful or persistently difficult swallowing  New shortness of breath  Fever of 100F or higher  Black, tarry-looking stools  For urgent or emergent issues, a gastroenterologist can be reached at any hour by calling [phone]. Do not use MyChart messaging for urgent concerns.    DIET:  We do recommend a small meal at first, but then you may proceed to your regular diet.  Drink plenty of fluids but you should avoid alcoholic beverages for 24 hours.  ACTIVITY:  You should plan to take it easy for the rest of today and you should NOT DRIVE or use heavy machinery until tomorrow (because of the sedation medicines used during the test).  FOLLOW UP: Our staff will call the number listed on your records the next business day following your procedure.  We will call around 7:15- 8:00 am to check on you and address any questions or concerns that you may have regarding the information given to you following your procedure. If we do not reach you, we will leave a message.     If any biopsies were taken you will be contacted by phone or by letter within the next 1-3 weeks.  Please call us  at [phone] if you have not heard about the biopsies in 3 weeks.    SIGNATURES/CONFIDENTIALITY: You and/or your care partner have signed paperwork which will be entered into your electronic medical record.  These signatures attest to the fact that that the information above on your After Visit Summary has been reviewed and is understood.  Full responsibility of the confidentiality of this discharge information lies with you and/or your care-partner.

## 2023-08-13 NOTE — Progress Notes (Signed)
 GASTROENTEROLOGY PROCEDURE H&P NOTE   Primary Care Physician: Cherre Cornish, NP    Reason for Procedure:  Hematochezia, change in bowel habits, GERD, heartburn  Plan:    EGD, colonoscopy  Patient is appropriate for endoscopic procedure(s) in the ambulatory ([facility]) setting.  The nature of the procedure, as well as the risks, benefits, and alternatives were carefully and thoroughly reviewed with the patient. Ample time for discussion and questions allowed. The patient understood, was satisfied, and agreed to proceed.     HPI: Kristin [name] is a 43 y.o. female who presents for EGD for evaluation of GERD and heartburn, along with colonoscopy for evaluation of hematochezia, change in bowel habits, and symptomatic perianal skin tags.  No significant changes in clinical history since last office appointment on 07/01/2023 with me.  Past Medical History:  Diagnosis Date   Abnormal Pap smear of cervix 04/16/2006   Arthritis    Depression    migrane   Gallstones    History of dysmenorrhea    Migraine    Obesity    Ulcer    stomach   Vein, varicose     Past Surgical History:  Procedure Laterality Date   GALLBLADDER SURGERY     LEEP  04/16/2006   WISDOM TOOTH EXTRACTION     x3    Prior to Admission medications   Medication Sig Start Date End Date Taking? Authorizing Provider  norethindrone  (ORTHO MICRONOR ) 0.35 MG tablet Take 1 tablet (0.35 mg total) by mouth daily. 05/07/23  Yes Cherre Cornish, NP  omeprazole  (PRILOSEC) 40 MG capsule TAKE 1 CAPSULE BY MOUTH EVERY DAY 06/24/23  Yes Cherre Cornish, NP  cyclobenzaprine  (FLEXERIL ) 10 MG tablet Take 0.5-1 tablets (5-10 mg total) by mouth 3 (three) times daily as needed for muscle spasms. Patient not taking: Reported on 07/01/2023 02/04/23   Cherre Cornish, NP  meloxicam  (MOBIC ) 15 MG tablet Take 1 tablet (15 mg total) by mouth daily. Patient not taking: Reported on 08/13/2023 02/04/23   Cherre Cornish, NP  ondansetron  (ZOFRAN ) 4 MG tablet Take 1  tablet (4 mg total) by mouth every 8 (eight) hours as needed for nausea or vomiting. 04/01/23   Cherre Cornish, NP  tirzepatide  (ZEPBOUND ) 5 MG/0.5ML Pen Inject 5 mg into the skin once a week. 05/07/23   Cherre Cornish, NP    Current Outpatient Medications  Medication Sig Dispense Refill   norethindrone  (ORTHO MICRONOR ) 0.35 MG tablet Take 1 tablet (0.35 mg total) by mouth daily. 84 tablet 4   omeprazole  (PRILOSEC) 40 MG capsule TAKE 1 CAPSULE BY MOUTH EVERY DAY 90 capsule 1   cyclobenzaprine  (FLEXERIL ) 10 MG tablet Take 0.5-1 tablets (5-10 mg total) by mouth 3 (three) times daily as needed for muscle spasms. (Patient not taking: Reported on 07/01/2023) 30 tablet 0   meloxicam  (MOBIC ) 15 MG tablet Take 1 tablet (15 mg total) by mouth daily. (Patient not taking: Reported on 08/13/2023) 30 tablet 0   ondansetron  (ZOFRAN ) 4 MG tablet Take 1 tablet (4 mg total) by mouth every 8 (eight) hours as needed for nausea or vomiting. 20 tablet 0   tirzepatide  (ZEPBOUND ) 5 MG/0.5ML Pen Inject 5 mg into the skin once a week. 6 mL 1   Current Facility-Administered Medications  Medication Dose Route Frequency Provider Last Rate Last Admin   0.9 %  sodium chloride  infusion  500 mL Intravenous Once Fady Stamps V, DO        Allergies as of 08/13/2023 - Review Complete 08/13/2023  Allergen  Reaction Noted   Vicodin [hydrocodone -acetaminophen ] Nausea And Vomiting 12/12/2013    Family History  Problem Relation Age of Onset   Breast cancer Mother 41       neg for BRCA gene   Hyperlipidemia Father    Down syndrome Paternal Uncle    Breast cancer Maternal Grandmother        breast   Depression Maternal Grandfather    Diabetes Maternal Grandfather    Depression Paternal Grandmother    Hyperlipidemia Paternal Grandmother    Hyperlipidemia Paternal Grandfather    Stomach cancer Maternal Great-grandmother    Liver disease Neg Hx    Esophageal cancer Neg Hx    Colon cancer Neg Hx    Rectal cancer Neg Hx      Social History   Socioeconomic History   Marital status: Married    Spouse name: Arlyce Lambert   Number of children: 4   Years of education: Not on file   Highest education level: Bachelor's degree (e.g., BA, AB, BS)  Occupational History   Occupation: HOmemaker  Tobacco Use   Smoking status: Former    Current packs/day: 0.00    Types: Cigarettes    Quit date: 12/16/2015    Years since quitting: 7.6   Smokeless tobacco: Never  Vaping Use   Vaping status: Every Day  Substance and Sexual Activity   Alcohol use: No    Alcohol/week: 7.0 - 10.0 standard drinks of alcohol    Types: 7 - 10 Standard drinks or equivalent per week    Comment: " a few drinks a night"   Drug use: No   Sexual activity: Yes    Partners: Male    Birth control/protection: None, I.U.D.  Other Topics Concern   Not on file  Social History Narrative   Married to [employer].  Children Dwayne Given, Isa Manuel and Audubon lives with her.   Social Drivers of Corporate investment banker Strain: Not on file  Food Insecurity: Not on file  Transportation Needs: Not on file  Physical Activity: Not on file  Stress: Not on file  Social Connections: Not on file  Intimate Partner Violence: Not on file    Physical Exam: Vital signs in last 24 hours: @BP  (!) 128/92   Pulse 84   Temp 98.5 F (36.9 C)   Ht 5\' 7"  (1.702 m)   Wt 205 lb (93 kg)   SpO2 100%   BMI 32.11 kg/m  GEN: NAD EYE: Sclerae anicteric ENT: MMM CV: Non-tachycardic Pulm: CTA b/l GI: Soft, NT/ND NEURO:  Alert & Oriented x 3   Harry Lindau, DO [facility]   08/13/2023 1:41 PM

## 2023-08-13 NOTE — Progress Notes (Signed)
 Pt's states no medical or surgical changes since previsit or office visit.

## 2023-08-13 NOTE — Op Note (Signed)
 [facility] Patient Name: Kristin [name] Procedure Date: 08/13/2023 12:34 PM MRN: [id number] Endoscopist: Harry Lindau , MD, [id number] Age: 43 Referring MD:  Date of Birth: 05-25-1980 Gender: Female Account #: [id number] Procedure:                Upper GI endoscopy Indications:              Heartburn, Esophageal reflux Medicines:                Monitored Anesthesia Care Procedure:                Pre-Anesthesia Assessment:                           - Prior to the procedure, a History and Physical                            was performed, and patient medications and                            allergies were reviewed. The patient's tolerance of                            previous anesthesia was also reviewed. The risks                            and benefits of the procedure and the sedation                            options and risks were discussed with the patient.                            All questions were answered, and informed consent                            was obtained. Prior Anticoagulants: The patient has                            taken no anticoagulant or antiplatelet agents. ASA                            Grade Assessment: II - A patient with mild systemic                            disease. After reviewing the risks and benefits,                            the patient was deemed in satisfactory condition to                            undergo the procedure.                           After obtaining informed consent, the endoscope was  passed under direct vision. Throughout the                            procedure, the patient's blood pressure, pulse, and                            oxygen saturations were monitored continuously. The                            Olympus Scope [id number] was introduced through the                            mouth, and advanced to the second part of duodenum.                            The upper GI  endoscopy was accomplished without                            difficulty. The patient tolerated the procedure                            well. Scope In: Scope Out: Findings:                 The examined esophagus was normal.                           The Z-line was regular and was found 37 cm from the                            incisors.                           The gastroesophageal flap valve was visualized                            endoscopically and classified as Hill Grade III                            (minimal fold, loose to endoscope, hiatal hernia                            likely).                           The entire examined stomach was normal.                           The examined duodenum was normal. Complications:            No immediate complications. Estimated Blood Loss:     Estimated blood loss: none. Impression:               - Normal esophagus.                           - Z-line regular, 37 cm from the incisors.                           -  Gastroesophageal flap valve classified as Hill                            Grade III (minimal fold, loose to endoscope, hiatal                            hernia likely).                           - Normal stomach.                           - Normal examined duodenum.                           - No specimens collected. Recommendation:           - Patient has a contact number available for                            emergencies. The signs and symptoms of potential                            delayed complications were discussed with the                            patient. Return to normal activities tomorrow.                            Written discharge instructions were provided to the                            patient.                           - Resume previous diet.                           - Continue present medications. Harry Lindau, MD 08/13/2023 2:16:01 PM

## 2023-08-13 NOTE — Op Note (Signed)
 [facility] Patient Name: Kristin [name] Procedure Date: 08/13/2023 12:35 PM MRN: [id number] Endoscopist: Harry Lindau , MD, [id number] Age: 43 Referring MD:  Date of Birth: 1981-02-02 Gender: Female Account #: [id number] Procedure:                Colonoscopy Indications:              Hematochezia, Change in bowel habits Medicines:                Monitored Anesthesia Care Procedure:                Pre-Anesthesia Assessment:                           - Prior to the procedure, a History and Physical                            was performed, and patient medications and                            allergies were reviewed. The patient's tolerance of                            previous anesthesia was also reviewed. The risks                            and benefits of the procedure and the sedation                            options and risks were discussed with the patient.                            All questions were answered, and informed consent                            was obtained. Prior Anticoagulants: The patient has                            taken no anticoagulant or antiplatelet agents. ASA                            Grade Assessment: II - A patient with mild systemic                            disease. After reviewing the risks and benefits,                            the patient was deemed in satisfactory condition to                            undergo the procedure.                           After obtaining informed consent, the colonoscope  was passed under direct vision. Throughout the                            procedure, the patient's blood pressure, pulse, and                            oxygen saturations were monitored continuously. The                            CF [id number] #[id number] was introduced through the anus                            and advanced to the the terminal ileum. The                            colonoscopy was  performed without difficulty. The                            patient tolerated the procedure well. The quality                            of the bowel preparation was good. The terminal                            ileum, ileocecal valve, appendiceal orifice, and                            rectum were photographed. Scope In: 1:54:46 PM Scope Out: 2:08:23 PM Scope Withdrawal Time: 0 hours 9 minutes 48 seconds  Total Procedure Duration: 0 hours 13 minutes 37 seconds  Findings:                 Skin tags were found on perianal exam.                           A 2 mm polyp was found in the sigmoid colon. The                            polyp was sessile. The polyp was removed with a                            cold snare. Resection and retrieval were complete.                            Estimated blood loss was minimal.                           A few small-mouthed diverticula were found in the                            sigmoid colon.                           Non-bleeding internal hemorrhoids were found during  retroflexion. The hemorrhoids were medium-sized and                            Grade II (internal hemorrhoids that prolapse but                            reduce spontaneously).                           The terminal ileum appeared normal. Complications:            No immediate complications. Estimated Blood Loss:     Estimated blood loss was minimal. Impression:               - Perianal skin tags found on perianal exam.                           - One 2 mm polyp in the sigmoid colon, removed with                            a cold snare. Resected and retrieved.                           - Diverticulosis in the sigmoid colon.                           - Non-bleeding internal hemorrhoids.                           - The examined portion of the ileum was normal. Recommendation:           - Patient has a contact number available for                             emergencies. The signs and symptoms of potential                            delayed complications were discussed with the                            patient. Return to normal activities tomorrow.                            Written discharge instructions were provided to the                            patient.                           - Resume previous diet.                           - Continue present medications.                           - Await pathology results.                           -  Repeat colonoscopy in 7-10 years for surveillance                            based on pathology results. Harry Lindau, MD 08/13/2023 2:19:18 PM

## 2023-08-14 ENCOUNTER — Telehealth: Payer: Self-pay

## 2023-08-14 NOTE — Telephone Encounter (Signed)
 13 pages faxed to [facility].  Fax confirmation was received.

## 2023-08-14 NOTE — Telephone Encounter (Signed)
 Left message on follow up call.

## 2023-08-16 LAB — SURGICAL PATHOLOGY

## 2023-08-24 ENCOUNTER — Encounter: Payer: Self-pay | Admitting: Gastroenterology

## 2023-09-04 ENCOUNTER — Telehealth: Payer: Self-pay

## 2023-09-04 NOTE — Telephone Encounter (Signed)
 Pharmacy Patient Advocate Encounter   Received notification from CoverMyMeds that prior authorization for ZEPBOUND  is due for renewal.   Insurance verification completed.   The patient is insured through [facility].  Action: PA required; PA submitted to above mentioned insurance via CoverMyMeds Key/confirmation #/EOC [id number] Status is pending

## 2023-09-04 NOTE — Telephone Encounter (Signed)
 Pharmacy Patient Advocate Encounter  Received notification from [employer] [facility] that Prior Authorization for ZEPBOUND  has been APPROVED from 09/04/23 to 09/03/24   PA #/Case ID/Reference #:  [id number]

## 2023-10-16 NOTE — Telephone Encounter (Signed)
 Phone call to [facility] and I spoke with [place] who stated their office has made several attempts to reach the patient by phone but there has been no appointment made due to patient not answering the phone.  They have left several messages on voicemail, but the patient has not called back.

## 2023-11-05 ENCOUNTER — Ambulatory Visit (INDEPENDENT_AMBULATORY_CARE_PROVIDER_SITE_OTHER): Payer: Managed Care, Other (non HMO) | Admitting: Medical-Surgical

## 2023-11-05 ENCOUNTER — Encounter: Payer: Self-pay | Admitting: Medical-Surgical

## 2023-11-05 VITALS — BP 106/69 | HR 76 | Resp 20 | Ht 67.0 in | Wt 187.1 lb

## 2023-11-05 DIAGNOSIS — N926 Irregular menstruation, unspecified: Secondary | ICD-10-CM

## 2023-11-05 DIAGNOSIS — I8393 Asymptomatic varicose veins of bilateral lower extremities: Secondary | ICD-10-CM | POA: Diagnosis not present

## 2023-11-05 DIAGNOSIS — E66812 Obesity, class 2: Secondary | ICD-10-CM | POA: Diagnosis not present

## 2023-11-05 DIAGNOSIS — Z6839 Body mass index (BMI) 39.0-39.9, adult: Secondary | ICD-10-CM

## 2023-11-05 DIAGNOSIS — N951 Menopausal and female climacteric states: Secondary | ICD-10-CM

## 2023-11-05 MED ORDER — ONDANSETRON HCL 4 MG PO TABS: 4.0000 mg | ORAL_TABLET | Freq: Three times a day (TID) | ORAL | 0 refills | Status: AC | PRN

## 2023-11-05 MED ORDER — ZEPBOUND 5 MG/0.5ML ~~LOC~~ SOAJ: 5.0000 mg | SUBCUTANEOUS | 0 refills | Status: DC

## 2023-11-05 NOTE — Progress Notes (Signed)
 Established patient visit  Discussed the use of AI scribe software for clinical note transcription with the patient, who gave verbal consent to proceed.  History of Present Illness   Kristin [name] is a 43 year old female who presents with concerns about medication coverage and irregular menstrual cycles.  Pharmacologic weight management - Currently on Zepbound  5 mg since September - Weight decreased from 241 pounds to 187 pounds since initiation of Zepbound  - Insurance now requires transition to [place], causing apprehension due to potential side effects - Experienced gastrointestinal symptoms when starting Zepbound  - Concerned about starting Wegovy at a high dose due to prior GI intolerance - Increased hunger and sugar cravings occur two days after Zepbound  injection, managed with dietary control  Peripheral edema - Swelling present, particularly in the calves - Concerned about possible lipedema or lymphedema - Plans to consult a vein specialist after summer  Menstrual irregularity and associated symptoms - Irregular menstrual cycles since switching to Micronor  after IUD removal one year ago - Cycles characterized by prolonged and frequent periods, with both spotting and heavy bleeding - Menstrual bleeding interferes with daily activities - Severe cramps and night sweats disrupt sleep - Consistently takes Micronor  at 10 PM daily      Physical Exam Vitals reviewed.  Constitutional:      General: She is not in acute distress.    Appearance: Normal appearance. She is not ill-appearing.  HENT:     Head: Normocephalic and atraumatic.  Cardiovascular:     Rate and Rhythm: Normal rate and regular rhythm.     Pulses: Normal pulses.     Heart sounds: Normal heart sounds. No murmur heard.    No friction rub. No gallop.  Pulmonary:     Effort: Pulmonary effort is normal. No respiratory distress.     Breath sounds: Normal breath sounds. No wheezing.  Skin:    General: Skin  is warm and dry.  Neurological:     Mental Status: She is alert and oriented to person, place, and time.  Psychiatric:        Mood and Affect: Mood normal.        Behavior: Behavior normal.        Thought Content: Thought content normal.        Judgment: Judgment normal.     Assessment and Plan    Weight management Significant weight loss on Zepbound . Considering dose increase or switch to [facility] due to insurance. Concerns about GI side effects and sugar cravings. - Send refill for Zepbound  and await insurance response. - If Zepbound  not covered, start Wegovy at 0.5 mg weekly for four weeks, then adjust based on tolerance and effectiveness.  Abnormal uterine bleeding Irregular cycles possibly due to hormonal fluctuations, weight loss, or birth control interaction. Pelvic ultrasound needed to rule out structural causes. - Order pelvic ultrasound to evaluate for structural causes of abnormal uterine bleeding.  Perimenopausal symptoms with Sleep disturbance Experiencing night sweats, mood swings, and sleep difficulties. Discussed OTC supplements and Effexor for symptom management. Difficulty sleeping with anxiety and racing thoughts. Hydroxyzine  suggested for sleep and anxiety management. - Discuss OTC options such as Estroven, Amberin, melatonin, valerian root, and kava. - Consider Effexor for managing mood swings and night sweats. - Prescribe hydroxyzine  10 mg, adjust dose between 10 to 30 mg before bed as needed.  Lipedema vs. Lymphedema Swelling and fat accumulation in lower extremities. Referral to vein specialist for evaluation and management. - Refer  to [facility] for evaluation and management of potential lipedema or lymphedema.      Return in about 6 months (around 05/07/2024) for weight check or sooner if needed.  __________________________________ Zada FREDRIK Palin, DNP, APRN, FNP-BC Primary Care and Sports Medicine [facility]

## 2023-11-06 ENCOUNTER — Encounter: Payer: Self-pay | Admitting: Medical-Surgical

## 2023-11-07 MED ORDER — SLYND 4 MG PO TABS: 1.0000 | ORAL_TABLET | Freq: Every day | ORAL | 3 refills | Status: DC

## 2023-11-08 ENCOUNTER — Other Ambulatory Visit (HOSPITAL_COMMUNITY): Payer: Self-pay

## 2023-11-08 MED ORDER — WEGOVY 1 MG/0.5ML ~~LOC~~ SOAJ: 1.0000 mg | SUBCUTANEOUS | 0 refills | Status: DC

## 2023-11-08 NOTE — Addendum Note (Signed)
 Addended byBETHA WILLO MINI on: 11/08/2023 12:22 PM   Modules accepted: Orders

## 2023-11-26 ENCOUNTER — Telehealth: Admitting: Physician Assistant

## 2023-11-26 DIAGNOSIS — B9689 Other specified bacterial agents as the cause of diseases classified elsewhere: Secondary | ICD-10-CM | POA: Diagnosis not present

## 2023-11-26 DIAGNOSIS — J208 Acute bronchitis due to other specified organisms: Secondary | ICD-10-CM | POA: Diagnosis not present

## 2023-11-26 MED ORDER — AZITHROMYCIN 250 MG PO TABS: ORAL_TABLET | ORAL | 0 refills | Status: AC

## 2023-11-26 MED ORDER — BENZONATATE 100 MG PO CAPS
100.0000 mg | ORAL_CAPSULE | Freq: Three times a day (TID) | ORAL | 0 refills | Status: DC | PRN
Start: 2023-11-26 — End: 2023-12-30

## 2023-11-26 NOTE — Progress Notes (Signed)
 Virtual Visit Consent   Kristin [name], you are scheduled for a virtual visit with a [facility] provider today. Just as with appointments in the office, your consent must be obtained to participate. Your consent will be active for this visit and any virtual visit you may have with one of our providers in the next 365 days. If you have a MyChart account, a copy of this consent can be sent to you electronically.  As this is a virtual visit, video technology does not allow for your provider to perform a traditional examination. This may limit your provider's ability to fully assess your condition. If your provider identifies any concerns that need to be evaluated in person or the need to arrange testing (such as labs, EKG, etc.), we will make arrangements to do so. Although advances in technology are sophisticated, we cannot ensure that it will always work on either your end or our end. If the connection with a video visit is poor, the visit may have to be switched to a telephone visit. With either a video or telephone visit, we are not always able to ensure that we have a secure connection.  By engaging in this virtual visit, you consent to the provision of healthcare and authorize for your insurance to be billed (if applicable) for the services provided during this visit. Depending on your insurance coverage, you may receive a charge related to this service.  I need to obtain your verbal consent now. Are you willing to proceed with your visit today? Kristin [name] has provided verbal consent on 11/26/2023 for a virtual visit (video or telephone). Kristin [name], [place]  Date: 11/26/2023 6:58 PM   Virtual Visit via Video Note   I, Kristin [name], connected with  Kristin [name]  ([id number], [birth year]) on 11/26/23 at  7:00 PM EDT by a video-enabled telemedicine application and verified that I am speaking with the correct person using two identifiers.  Location: Patient: Virtual Visit  Location Patient: Home Provider: Virtual Visit Location Provider: Home Office   I discussed the limitations of evaluation and management by telemedicine and the availability of in person appointments. The patient expressed understanding and agreed to proceed.    History of Present Illness: Kristin [name] is a 43 y.o. who identifies as a female who was assigned female at birth, and is being seen today for 2+ weeks of symptoms starting with fatigue, chills, sweats followed by development of sore throat, congestion and cough that has been persistent and worsening. Initially took a home COVID test as a precaution that was negative. Thought was a virus and was so trying to wait it out but it has not improved. Cough is now productive of thick phlegm. Denies fever.  OTC -- Tussin-DM, Tylenol  and Motrin .   HPI: HPI  Problems:  Patient Active Problem List   Diagnosis Date Noted   Gastroesophageal reflux disease 12/15/2019   Hepatic lesion 12/15/2019   Thickening of wall of gallbladder 12/15/2019   Calculus of gallbladder without cholecystitis without obstruction 12/15/2019   Lower extremity edema 11/12/2018   Hemorrhoids 08/20/2016   History of loop electrical excision procedure (LEEP) 05/15/2016    Allergies:  Allergies  Allergen Reactions   Vicodin [Hydrocodone -Acetaminophen ] Nausea And Vomiting   Medications:  Current Outpatient Medications:    azithromycin  (ZITHROMAX ) 250 MG tablet, Take 2 tablets on day 1, then 1 tablet daily on days 2 through 5, Disp: 6 tablet, Rfl: 0   benzonatate  (TESSALON ) 100 MG  capsule, Take 1 capsule (100 mg total) by mouth 3 (three) times daily as needed for cough., Disp: 30 capsule, Rfl: 0   MELEYA  0.35 MG tablet, SMARTSIG:1 Tablet(s) Daily, Disp: , Rfl:    omeprazole  (PRILOSEC) 40 MG capsule, TAKE 1 CAPSULE BY MOUTH EVERY DAY, Disp: 90 capsule, Rfl: 1   ondansetron  (ZOFRAN ) 4 MG tablet, Take 1 tablet (4 mg total) by mouth every 8 (eight) hours as needed for  nausea or vomiting., Disp: 20 tablet, Rfl: 0   WEGOVY  1 MG/0.5ML SOAJ, Inject 1 mg into the skin once a week. Use this dose for 1 month (4 shots) and then increase to next higher dose., Disp: 2 mL, Rfl: 0  Observations/Objective: Patient is well-developed, well-nourished in no acute distress.  Resting comfortably at home.  Head is normocephalic, atraumatic.  No labored breathing. Speech is clear and coherent with logical content.  Patient is alert and oriented at baseline.   Assessment and Plan: 1. Acute bacterial bronchitis (Primary) - azithromycin  (ZITHROMAX ) 250 MG tablet; Take 2 tablets on day 1, then 1 tablet daily on days 2 through 5  Dispense: 6 tablet; Refill: 0 - benzonatate  (TESSALON ) 100 MG capsule; Take 1 capsule (100 mg total) by mouth 3 (three) times daily as needed for cough.  Dispense: 30 capsule; Refill: 0  Rx Azithromycin .  Increase fluids.  Rest.  Saline nasal spray.  Probiotic.  Mucinex as directed.  Humidifier in bedroom. Tessalon  per orders.  Call or return to clinic if symptoms are not improving.   Follow Up Instructions: I discussed the assessment and treatment plan with the patient. The patient was provided an opportunity to ask questions and all were answered. The patient agreed with the plan and demonstrated an understanding of the instructions.  A copy of instructions were sent to the patient via MyChart unless otherwise noted below.   The patient was advised to call back or seek an in-person evaluation if the symptoms worsen or if the condition fails to improve as anticipated.    Kristin Velma Lunger, PA-C

## 2023-11-26 NOTE — Patient Instructions (Signed)
 Samyria L Soward, thank you for joining [name], PA-C for today's virtual visit.  While this provider is not your primary care provider (PCP), if your PCP is located in our provider database this encounter information will be shared with them immediately following your visit.   A [facility] MyChart account gives you access to today's visit and all your visits, tests, and labs performed at [facility]  click here if you don't have a [place] MyChart account or go to [email or web address]  Consent: (Patient) Ardena L Avallone provided verbal consent for this virtual visit at the beginning of the encounter.  Current Medications:  Current Outpatient Medications:    MELEYA  0.35 MG tablet, SMARTSIG:1 Tablet(s) Daily, Disp: , Rfl:    omeprazole  (PRILOSEC) 40 MG capsule, TAKE 1 CAPSULE BY MOUTH EVERY DAY, Disp: 90 capsule, Rfl: 1   ondansetron  (ZOFRAN ) 4 MG tablet, Take 1 tablet (4 mg total) by mouth every 8 (eight) hours as needed for nausea or vomiting., Disp: 20 tablet, Rfl: 0   WEGOVY  1 MG/0.5ML SOAJ, Inject 1 mg into the skin once a week. Use this dose for 1 month (4 shots) and then increase to next higher dose., Disp: 2 mL, Rfl: 0   Medications ordered in this encounter:  No orders of the defined types were placed in this encounter.    *If you need refills on other medications prior to your next appointment, please contact your pharmacy*  Follow-Up: Call back or seek an in-person evaluation if the symptoms worsen or if the condition fails to improve as anticipated.  [facility] [phone]  Other Instructions Take antibiotic (Azithromycin ) as directed.  Increase fluids.  Get plenty of rest. Use Mucinex for congestion. Use the Tessalon  as directed. Take a daily probiotic (I recommend Align or Culturelle, but even Activia Yogurt may be beneficial).  A humidifier placed in the bedroom may offer some relief for a dry, scratchy throat of nasal  irritation.  Read information below on acute bronchitis. Please call or return to clinic if symptoms are not improving.  Acute Bronchitis Bronchitis is when the airways that extend from the windpipe into the lungs get red, puffy, and painful (inflamed). Bronchitis often causes thick spit (mucus) to develop. This leads to a cough. A cough is the most common symptom of bronchitis. In acute bronchitis, the condition usually begins suddenly and goes away over time (usually in 2 weeks). Smoking, allergies, and asthma can make bronchitis worse. Repeated episodes of bronchitis may cause more lung problems.  HOME CARE Rest. Drink enough fluids to keep your pee (urine) clear or pale yellow (unless you need to limit fluids as told by your doctor). Only take over-the-counter or prescription medicines as told by your doctor. Avoid smoking and secondhand smoke. These can make bronchitis worse. If you are a smoker, think about using nicotine gum or skin patches. Quitting smoking will help your lungs heal faster. Reduce the chance of getting bronchitis again by: Washing your hands often. Avoiding people with cold symptoms. Trying not to touch your hands to your mouth, nose, or eyes. Follow up with your doctor as told.  GET HELP IF: Your symptoms do not improve after 1 week of treatment. Symptoms include: Cough. Fever. Coughing up thick spit. Body aches. Chest congestion. Chills. Shortness of breath. Sore throat.  GET HELP RIGHT AWAY IF:  You have an increased fever. You have chills. You have severe shortness of breath. You have bloody thick spit (sputum). You  throw up (vomit) often. You lose too much body fluid (dehydration). You have a severe headache. You faint.  MAKE SURE YOU:  Understand these instructions. Will watch your condition. Will get help right away if you are not doing well or get worse. Document Released: 09/19/2007 Document Revised: 12/03/2012 Document Reviewed:  09/23/2012 [facility] Patient Information 2015 [place], [place]. This information is not intended to replace advice given to you by your health care provider. Make sure you discuss any questions you have with your health care provider.    If you have been instructed to have an in-person evaluation today at a local Urgent Care facility, please use the link below. It will take you to a list of all of our available [place] [facility], including address, phone number and hours of operation. Please do not delay care.  [facility] [facility]  If you or a family member do not have a primary care provider, use the link below to schedule a visit and establish care. When you choose a [facility] primary care physician or advanced practice provider, you gain a long-term partner in health. Find a Primary Care Provider  Learn more about [facility] in-office and virtual care options:  - Get Care Now

## 2023-11-27 ENCOUNTER — Other Ambulatory Visit: Payer: Self-pay | Admitting: Medical-Surgical

## 2023-11-28 ENCOUNTER — Other Ambulatory Visit: Payer: Self-pay | Admitting: Medical-Surgical

## 2023-11-28 MED ORDER — HYDROXYZINE PAMOATE 25 MG PO CAPS: 25.0000 mg | ORAL_CAPSULE | Freq: Three times a day (TID) | ORAL | 0 refills | Status: DC | PRN

## 2023-12-06 ENCOUNTER — Other Ambulatory Visit: Payer: Self-pay | Admitting: Medical-Surgical

## 2023-12-06 DIAGNOSIS — E66812 Obesity, class 2: Secondary | ICD-10-CM

## 2023-12-06 NOTE — Telephone Encounter (Signed)
 Last filled 11/08/2023  Last OV 11/05/2023

## 2023-12-09 ENCOUNTER — Other Ambulatory Visit: Payer: Self-pay

## 2023-12-09 MED ORDER — WEGOVY 1 MG/0.5ML ~~LOC~~ SOAJ: 1.0000 mg | SUBCUTANEOUS | 0 refills | Status: DC

## 2023-12-09 NOTE — Addendum Note (Signed)
 Addended byBETHA WILLO MINI on: 12/09/2023 04:44 PM   Modules accepted: Orders

## 2023-12-23 MED ORDER — OMEPRAZOLE 40 MG PO CPDR: 40.0000 mg | DELAYED_RELEASE_CAPSULE | Freq: Every day | ORAL | 1 refills | Status: AC

## 2023-12-23 NOTE — Addendum Note (Signed)
 Addended by: BONNY JON DEL on: 12/23/2023 01:30 PM   Modules accepted: Orders

## 2023-12-30 ENCOUNTER — Encounter: Payer: Self-pay | Admitting: Medical-Surgical

## 2023-12-30 ENCOUNTER — Ambulatory Visit: Admitting: Medical-Surgical

## 2023-12-30 VITALS — BP 110/70 | HR 72 | Resp 20 | Ht 67.0 in | Wt 184.0 lb

## 2023-12-30 DIAGNOSIS — R052 Subacute cough: Secondary | ICD-10-CM | POA: Diagnosis not present

## 2023-12-30 DIAGNOSIS — E66812 Obesity, class 2: Secondary | ICD-10-CM

## 2023-12-30 DIAGNOSIS — M545 Low back pain, unspecified: Secondary | ICD-10-CM

## 2023-12-30 DIAGNOSIS — Z6839 Body mass index (BMI) 39.0-39.9, adult: Secondary | ICD-10-CM

## 2023-12-30 MED ORDER — BENZONATATE 100 MG PO CAPS: 100.0000 mg | ORAL_CAPSULE | Freq: Three times a day (TID) | ORAL | 0 refills | Status: DC | PRN

## 2023-12-30 MED ORDER — CYCLOBENZAPRINE HCL 10 MG PO TABS: 5.0000 mg | ORAL_TABLET | Freq: Three times a day (TID) | ORAL | 0 refills | Status: DC | PRN

## 2023-12-30 MED ORDER — WEGOVY 1 MG/0.5ML ~~LOC~~ SOAJ: 1.0000 mg | SUBCUTANEOUS | 0 refills | Status: DC

## 2023-12-30 NOTE — Progress Notes (Signed)
 Established patient visit   History of Present Illness   Discussed the use of AI scribe software for clinical note transcription with the patient, who gave verbal consent to proceed.  History of Present Illness   Kristin [name] is a 43 year old female who presents with swelling, nausea, and constipation.  Peripheral edema and fluid retention - Swelling in fingers and ankles - Described as fluid retention - Bloating affects clothing size, requiring larger pants - Weight previously at 181 at home pounds, with fluctuations during menstrual cycle  Gastrointestinal symptoms - Persistent nausea and constipation since switching to Wegovy  - Bowel movements occur every three days - No relief with increased fiber and water intake - Bloating present - Nausea and constipation worsened after increasing Wegovy  dose to 1 mg - Concern about further increasing Wegovy  dose due to gastrointestinal side effects - Return of hunger sensations despite medication change  Pharmacologic weight management - Currently on Wegovy  1 mg, increased from a lower dose - Previously lost about one pound per week on Zepound - Insurance changes required switch from Zepound to Wegovy  - Frustration with lack of weight loss on current regimen  Cough and associated back pain - History of coughing leading to midline back pain - Back pain exacerbated by sitting or bending over - Pain has persisted for three weeks - Associated with previous episode of bronchitis - Nighttime coughing affects sleep - Takes Tessalon  Perles for cough at night, effective - Uses dual-action ibuprofen  for back pain without significant relief     Physical Exam   Physical Exam Vitals reviewed.  Constitutional:      General: She is not in acute distress.    Appearance: Normal appearance. She is not ill-appearing.  HENT:     Head: Normocephalic and atraumatic.  Cardiovascular:     Rate and Rhythm: Normal rate and regular rhythm.      Pulses: Normal pulses.     Heart sounds: Normal heart sounds. No murmur heard.    No friction rub. No gallop.  Pulmonary:     Effort: Pulmonary effort is normal. No respiratory distress.     Breath sounds: Normal breath sounds. No wheezing.  Musculoskeletal:     Lumbar back: Tenderness (midline) present.  Skin:    General: Skin is warm and dry.  Neurological:     Mental Status: She is alert and oriented to person, place, and time.  Psychiatric:        Mood and Affect: Mood normal.        Behavior: Behavior normal.        Thought Content: Thought content normal.        Judgment: Judgment normal.    Assessment & Plan   Assessment and Plan    Class 2 severe obesity due to excess calories with serious comorbidity and BMI of 39.0-39.9 Obesity class 2 with nausea, constipation, and peripheral edema. Significant nausea and constipation on 1 mg Wegovy . Concerns about dose increase due to symptoms. Insurance issues with Zepbound  affecting weight loss. Discussed long-term health benefits of weight loss and lifestyle changes. - Continue 1 mg Wegovy . - Send in three months' worth of Wegovy  prescription. - Discuss compounded or manufacturer options for Zepbound  with husband.  Subacute cough Chronic dry cough persisting for several weeks, exacerbating back pain. - Refill Tessalon  Perles for cough management.  Acute midline low back pain without sciatica Midline low back pain caused by coughing. Possible muscle strain or disc issue. Prefers  to wait before imaging. - Prescribe Flexeril  for muscle relaxation. - Continue anti-inflammatory medication. - Consider x-ray if symptoms do not improve in a week.  General Health Maintenance Overdue mammogram with preference for ultrasound. Declined flu shot due to past experiences. Blood work and physical exam deferred. - Schedule annual physical and breast exam. - Discuss mammogram options, including ultrasound.      Follow up   Return in  about 3 months (around 03/30/2024) for annual physical exam or sooner if needed. __________________________________ Zada FREDRIK Palin, DNP, APRN, FNP-BC Primary Care and Sports Medicine [facility] [place]

## 2024-01-01 ENCOUNTER — Other Ambulatory Visit (HOSPITAL_COMMUNITY): Payer: Self-pay

## 2024-01-01 ENCOUNTER — Telehealth: Payer: Self-pay

## 2024-01-01 NOTE — Telephone Encounter (Addendum)
 Pharmacy Patient Advocate Encounter   Received notification from Patient Pharmacy that prior authorization for Wegovy  1mg /0.18ml is required/requested.   Insurance verification completed.   The patient is insured through [facility] .   Per test claim: Received a paid claim for the Wegovy  and there is a prior authorization that is good until 09/03/24

## 2024-01-06 NOTE — Telephone Encounter (Signed)
 Forwarding to Natalie Daniel for clarification.   Hello Rachana,  There is another part of the pharmacy technician notification that reads  Test Billing Result  Wegovy  1mg /0.30ml   Shows patient charge as $24.99   I think that the last note of paid claim for Zepbound  instead of Wegovy  could have been a documentation error.  I will forward to the pharmacy technician who sent the notification to check on this.   Thank you,  Luke Plenty, LPN

## 2024-01-07 ENCOUNTER — Other Ambulatory Visit: Payer: Self-pay | Admitting: Medical-Surgical

## 2024-01-08 NOTE — Telephone Encounter (Signed)
 Last read by Jyoti L Galer at 4:57PM on 01/06/2024.

## 2024-01-13 ENCOUNTER — Ambulatory Visit

## 2024-01-13 DIAGNOSIS — M545 Low back pain, unspecified: Secondary | ICD-10-CM | POA: Diagnosis not present

## 2024-01-17 ENCOUNTER — Ambulatory Visit: Payer: Self-pay | Admitting: Medical-Surgical

## 2024-02-13 ENCOUNTER — Encounter: Payer: Self-pay | Admitting: Family Medicine

## 2024-02-13 ENCOUNTER — Ambulatory Visit (INDEPENDENT_AMBULATORY_CARE_PROVIDER_SITE_OTHER): Admitting: Family Medicine

## 2024-02-13 VITALS — BP 116/79 | HR 67 | Ht 67.0 in | Wt 185.1 lb

## 2024-02-13 DIAGNOSIS — N921 Excessive and frequent menstruation with irregular cycle: Secondary | ICD-10-CM | POA: Diagnosis not present

## 2024-02-13 DIAGNOSIS — I7300 Raynaud's syndrome without gangrene: Secondary | ICD-10-CM | POA: Insufficient documentation

## 2024-02-13 LAB — POCT URINE PREGNANCY: Preg Test, Ur: NEGATIVE

## 2024-02-13 MED ORDER — MEDROXYPROGESTERONE ACETATE 10 MG PO TABS: 10.0000 mg | ORAL_TABLET | Freq: Every day | ORAL | 1 refills | Status: AC

## 2024-02-13 NOTE — Progress Notes (Signed)
 She reports that for her this is a heavy period in the sense that it is continuous,bright red,there are clots,and the length of this cycle.

## 2024-02-13 NOTE — Assessment & Plan Note (Signed)
 Raynaud's phenomenon Symptoms consistent with Raynaud's phenomenon, likely due to vasospasm. No cure, focus on symptom management. Discussed calcium channel blockers if symptoms worsen. - Advise non-pharmacological measures: keep hands warm, use gloves, consider hand warmers. - Discuss potential use of calcium channel blockers if symptoms worsen. - Consider steering wheel cover or heated steering wheel to reduce cold exposure while driving.

## 2024-02-13 NOTE — Progress Notes (Signed)
 Acute Office Visit  Patient ID: Kristin [name], female    DOB: [birth year], 43 y.o.   MRN: [id number]  PCP: Willo Mini, NP  Chief Complaint  Patient presents with   Metrorrhagia    Subjective:     HPI  Discussed the use of AI scribe software for clinical note transcription with the patient, who gave verbal consent to proceed.  History of Present Illness Kristin [name] is a 43 year old female who presents with prolonged menstrual bleeding.  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 - Last Pap smear performed last year, results normal - History of miscarriage, confirmed by blood test after initial negative urine test  Raynaud's phenomenon - Symptoms began last year - Hands become numb and turn white with cold exposure, such as while driving - Fingers turn blue as blood returns - Manages symptoms by keeping hands warm  Nicotine use and thrombotic risk - Current smoker, transitioned to vaping in attempt to quit - Aware of increased risk of blood clots associated with smoking and birth control use, particularly over age 20   ROS     Objective:    BP 116/79   Pulse 67   Ht 5' 7 (1.702 m)   Wt 185 lb 1.9 oz (84 kg)   LMP 01/26/2024 (Exact Date)   SpO2 97%   BMI 28.99 kg/m    Physical Exam Vitals reviewed.  Constitutional:      Appearance: Normal appearance.  HENT:     Head: Normocephalic.  Pulmonary:     Effort: Pulmonary effort is  normal.  Neurological:     Mental Status: She is alert and oriented to person, place, and time.  Psychiatric:        Mood and Affect: Mood normal.        Behavior: Behavior normal.       Results for orders placed or performed in visit on 02/13/24  POCT urine pregnancy  Result Value Ref Range   Preg Test, Ur Negative Negative       Assessment & Plan:   Problem List Items Addressed This Visit       Other   Raynaud phenomenon   Raynaud's phenomenon Symptoms consistent with Raynaud's phenomenon, likely due to vasospasm. No cure, focus on symptom management. Discussed calcium channel blockers if symptoms worsen. - Advise non-pharmacological measures: keep hands warm, use gloves, consider hand warmers. - Discuss potential use of calcium channel blockers if symptoms worsen. - Consider steering wheel cover or heated steering wheel to reduce cold exposure while driving.      Other Visit Diagnoses       Metrorrhagia    -  Primary   Relevant Orders   CBC   HgB A1c   POCT urine pregnancy (Completed)   Fe+TIBC+Fer   US  Pelvic Complete With Transvaginal   Beta HCG, Quant     Menorrhagia with irregular cycle       Relevant Orders   CBC   HgB A1c   Fe+TIBC+Fer   US  Pelvic Complete With  Transvaginal   Beta HCG, Quant       Assessment and Plan Assessment & Plan Excessive and frequent menstruation with irregular cycle Prolonged bleeding likely due to hormonal imbalance, possible fibroids, or thickened uterine lining. Current progesterone-only birth control may be ineffective. Risk of anemia. Discussed uterine ablation as a future option, not suitable for those desiring pregnancy. - Order pelvic ultrasound to assess uterine lining and check for fibroids. - Check complete blood count for anemia. - Prescribe high-dose progesterone to stop bleeding. - Instruct to start new birth control pill pack post-progesterone. - Discuss high-dose progesterone side effects: nausea,  sleepiness. - Advise follow-up with OB-GYN on November 17th for further management, including potential ablation.     Meds ordered this encounter  Medications   medroxyPROGESTERone (PROVERA) 10 MG tablet    Sig: Take 1 tablet (10 mg total) by mouth daily.    Dispense:  10 tablet    Refill:  1    No follow-ups on file.  Kristin Byars, MD [facility] Primary Care & Sports Medicine at [facility]

## 2024-02-14 ENCOUNTER — Ambulatory Visit (INDEPENDENT_AMBULATORY_CARE_PROVIDER_SITE_OTHER)

## 2024-02-14 ENCOUNTER — Ambulatory Visit: Payer: Self-pay | Admitting: Family Medicine

## 2024-02-14 DIAGNOSIS — N921 Excessive and frequent menstruation with irregular cycle: Secondary | ICD-10-CM | POA: Diagnosis not present

## 2024-02-14 LAB — CBC
Hematocrit: 38.8 % (ref 34.0–46.6)
Hemoglobin: 12.8 g/dL (ref 11.1–15.9)
MCH: 30.6 pg (ref 26.6–33.0)
MCHC: 33 g/dL (ref 31.5–35.7)
MCV: 93 fL (ref 79–97)
Platelets: 371 x10E3/uL (ref 150–450)
RBC: 4.18 x10E6/uL (ref 3.77–5.28)
RDW: 12.3 % (ref 11.7–15.4)
WBC: 6.9 x10E3/uL (ref 3.4–10.8)

## 2024-02-14 LAB — HEMOGLOBIN A1C
Est. average glucose Bld gHb Est-mCnc: 103 mg/dL
Hgb A1c MFr Bld: 5.2 % (ref 4.8–5.6)

## 2024-02-14 LAB — IRON,TIBC AND FERRITIN PANEL
Ferritin: 214 ng/mL — ABNORMAL HIGH (ref 15–150)
Iron Saturation: 33 % (ref 15–55)
Iron: 84 ug/dL (ref 27–159)
Total Iron Binding Capacity: 257 ug/dL (ref 250–450)
UIBC: 173 ug/dL (ref 131–425)

## 2024-02-14 LAB — BETA HCG QUANT (REF LAB): hCG Quant: <1 m[IU]/mL

## 2024-02-14 NOTE — Progress Notes (Signed)
 Hi Lorilyn, your iron levels look good overall.  In fact it has trended upward slightly over the last year which is great.  Blood counts normal is no sign of systemic infection or anemia.  Your hemoglobin has dropped slightly from 14 down to 12 so you definitely have lost some blood but the good news is that is that you are not anemic at least not yet.  A1c looks great no sign of diabetes.  And negative for any pregnancy.  Hopefully the bleeding is starting to slow down.

## 2024-02-17 NOTE — Progress Notes (Signed)
 HI Kristin [name].  Ultrasound shows a cyst measuring approximately 4.6 cm on your left ovary it does not look concerning it looks like what we call a simple cyst which is mostly more like a bubble.  But sometimes larger cyst can cause some discomfort.  They did see a leiomyoma in the uterus.  Fairly small only measuring about 2 cm so should not be necessarily making the bleeding worse.  I know you have your upcoming appointment on November 17.  Just mention to them that you did have the ultrasound done so they can also take a look at it.  Hopefully the progesterone helped slow everything down a little bit.  Fingers crossed.

## 2024-02-25 ENCOUNTER — Other Ambulatory Visit: Payer: Self-pay | Admitting: Medical-Surgical

## 2024-03-27 ENCOUNTER — Encounter: Payer: Self-pay | Admitting: Medical-Surgical

## 2024-03-27 MED ORDER — WEGOVY 1.7 MG/0.75ML ~~LOC~~ SOAJ: 1.7000 mg | SUBCUTANEOUS | 0 refills | Status: AC

## 2024-03-30 ENCOUNTER — Other Ambulatory Visit: Payer: Self-pay | Admitting: Obstetrics and Gynecology

## 2024-03-30 DIAGNOSIS — Z1231 Encounter for screening mammogram for malignant neoplasm of breast: Secondary | ICD-10-CM

## 2024-03-30 DIAGNOSIS — Z1239 Encounter for other screening for malignant neoplasm of breast: Secondary | ICD-10-CM

## 2024-04-02 ENCOUNTER — Other Ambulatory Visit (HOSPITAL_COMMUNITY): Payer: Self-pay

## 2024-04-02 ENCOUNTER — Telehealth: Payer: Self-pay

## 2024-04-02 NOTE — Telephone Encounter (Signed)
 Pharmacy Patient Advocate Encounter   Received notification from Onbase that prior authorization for WEGOVY  1.7MG  is required/requested.   Insurance verification completed.   The patient is insured through [facility].   Per test claim: Refill too soon. PA is not needed at this time. Medication was filled 04/01/24. Next eligible fill date is 04/22/24.

## 2024-04-20 ENCOUNTER — Other Ambulatory Visit
